# Patient Record
Sex: Female | Born: 1970 | Race: White | Hispanic: No | Marital: Single | Smoking: Current every day smoker
Health system: Southern US, Community
[De-identification: ages and names within clinical notes are randomized; demographics above are authoritative.]

## PROBLEM LIST (undated history)

## (undated) ENCOUNTER — Ambulatory Visit: Discharge status: Absent without leave | Payer: Self-pay

## (undated) DIAGNOSIS — K5790 Diverticulosis of intestine, part unspecified, without perforation or abscess without bleeding: Secondary | ICD-10-CM

## (undated) DIAGNOSIS — M545 Low back pain, unspecified: Secondary | ICD-10-CM

## (undated) DIAGNOSIS — K2970 Gastritis, unspecified, without bleeding: Secondary | ICD-10-CM

## (undated) DIAGNOSIS — R42 Dizziness and giddiness: Secondary | ICD-10-CM

## (undated) DIAGNOSIS — G894 Chronic pain syndrome: Secondary | ICD-10-CM

## (undated) DIAGNOSIS — IMO0002 Reserved for concepts with insufficient information to code with codable children: Secondary | ICD-10-CM

## (undated) DIAGNOSIS — N189 Chronic kidney disease, unspecified: Secondary | ICD-10-CM

## (undated) DIAGNOSIS — R202 Paresthesia of skin: Secondary | ICD-10-CM

## (undated) DIAGNOSIS — R200 Anesthesia of skin: Secondary | ICD-10-CM

## (undated) DIAGNOSIS — K449 Diaphragmatic hernia without obstruction or gangrene: Secondary | ICD-10-CM

## (undated) DIAGNOSIS — F419 Anxiety disorder, unspecified: Secondary | ICD-10-CM

## (undated) DIAGNOSIS — G43909 Migraine, unspecified, not intractable, without status migrainosus: Secondary | ICD-10-CM

## (undated) DIAGNOSIS — K219 Gastro-esophageal reflux disease without esophagitis: Secondary | ICD-10-CM

## (undated) HISTORY — DX: Dizziness and giddiness: R42

## (undated) HISTORY — DX: Gastro-esophageal reflux disease without esophagitis: K21.9

## (undated) HISTORY — DX: Reserved for concepts with insufficient information to code with codable children: IMO0002

## (undated) HISTORY — DX: Anesthesia of skin: R20.0

## (undated) HISTORY — DX: Low back pain, unspecified: M54.50

## (undated) HISTORY — DX: Low back pain: M54.5

## (undated) HISTORY — DX: Paresthesia of skin: R20.2

## (undated) HISTORY — DX: Anxiety disorder, unspecified: F41.9

## (undated) HISTORY — DX: Chronic kidney disease, unspecified: N18.9

## (undated) HISTORY — DX: Migraine, unspecified, not intractable, without status migrainosus: G43.909

## (undated) HISTORY — DX: Chronic pain syndrome: G89.4

---

## 1991-06-24 HISTORY — PX: TUBAL LIGATION: SHX77

## 2001-10-04 ENCOUNTER — Emergency Department (HOSPITAL_COMMUNITY): Admission: EM | Admit: 2001-10-04 | Discharge: 2001-10-04 | Payer: Self-pay | Admitting: Emergency Medicine

## 2001-10-04 ENCOUNTER — Encounter: Payer: Self-pay | Admitting: Emergency Medicine

## 2002-09-19 ENCOUNTER — Ambulatory Visit (HOSPITAL_COMMUNITY): Admission: RE | Admit: 2002-09-19 | Discharge: 2002-09-19 | Payer: Self-pay | Admitting: Family Medicine

## 2002-09-19 ENCOUNTER — Encounter: Payer: Self-pay | Admitting: Family Medicine

## 2003-03-11 ENCOUNTER — Encounter: Payer: Self-pay | Admitting: *Deleted

## 2003-03-11 ENCOUNTER — Emergency Department (HOSPITAL_COMMUNITY): Admission: EM | Admit: 2003-03-11 | Discharge: 2003-03-11 | Payer: Self-pay | Admitting: *Deleted

## 2003-08-17 ENCOUNTER — Ambulatory Visit (HOSPITAL_COMMUNITY): Admission: RE | Admit: 2003-08-17 | Discharge: 2003-08-17 | Payer: Self-pay | Admitting: Family Medicine

## 2003-12-10 ENCOUNTER — Emergency Department (HOSPITAL_COMMUNITY): Admission: EM | Admit: 2003-12-10 | Discharge: 2003-12-11 | Payer: Self-pay | Admitting: Emergency Medicine

## 2004-07-08 ENCOUNTER — Emergency Department (HOSPITAL_COMMUNITY): Admission: EM | Admit: 2004-07-08 | Discharge: 2004-07-08 | Payer: Self-pay | Admitting: *Deleted

## 2004-09-20 ENCOUNTER — Emergency Department (HOSPITAL_COMMUNITY): Admission: EM | Admit: 2004-09-20 | Discharge: 2004-09-20 | Payer: Self-pay | Admitting: *Deleted

## 2004-09-25 ENCOUNTER — Emergency Department (HOSPITAL_COMMUNITY): Admission: EM | Admit: 2004-09-25 | Discharge: 2004-09-25 | Payer: Self-pay | Admitting: Emergency Medicine

## 2005-01-04 ENCOUNTER — Emergency Department (HOSPITAL_COMMUNITY): Admission: EM | Admit: 2005-01-04 | Discharge: 2005-01-05 | Payer: Self-pay | Admitting: *Deleted

## 2005-01-05 ENCOUNTER — Emergency Department (HOSPITAL_COMMUNITY): Admission: EM | Admit: 2005-01-05 | Discharge: 2005-01-05 | Payer: Self-pay | Admitting: Emergency Medicine

## 2005-01-06 ENCOUNTER — Inpatient Hospital Stay (HOSPITAL_COMMUNITY): Admission: EM | Admit: 2005-01-06 | Discharge: 2005-01-07 | Payer: Self-pay | Admitting: *Deleted

## 2007-06-24 HISTORY — PX: CHOLECYSTECTOMY: SHX55

## 2007-10-05 ENCOUNTER — Emergency Department (HOSPITAL_COMMUNITY): Admission: EM | Admit: 2007-10-05 | Discharge: 2007-10-05 | Payer: Self-pay | Admitting: Emergency Medicine

## 2007-12-07 ENCOUNTER — Emergency Department (HOSPITAL_COMMUNITY): Admission: EM | Admit: 2007-12-07 | Discharge: 2007-12-07 | Payer: Self-pay | Admitting: Emergency Medicine

## 2008-02-06 ENCOUNTER — Emergency Department (HOSPITAL_COMMUNITY): Admission: EM | Admit: 2008-02-06 | Discharge: 2008-02-06 | Payer: Self-pay | Admitting: Emergency Medicine

## 2008-02-24 ENCOUNTER — Emergency Department (HOSPITAL_COMMUNITY): Admission: EM | Admit: 2008-02-24 | Discharge: 2008-02-24 | Payer: Self-pay | Admitting: Emergency Medicine

## 2008-05-04 ENCOUNTER — Emergency Department (HOSPITAL_COMMUNITY): Admission: EM | Admit: 2008-05-04 | Discharge: 2008-05-04 | Payer: Self-pay | Admitting: Emergency Medicine

## 2008-05-16 ENCOUNTER — Observation Stay (HOSPITAL_COMMUNITY): Admission: EM | Admit: 2008-05-16 | Discharge: 2008-05-17 | Payer: Self-pay | Admitting: Emergency Medicine

## 2008-05-17 ENCOUNTER — Encounter (INDEPENDENT_AMBULATORY_CARE_PROVIDER_SITE_OTHER): Payer: Self-pay | Admitting: General Surgery

## 2009-05-12 ENCOUNTER — Emergency Department (HOSPITAL_COMMUNITY): Admission: EM | Admit: 2009-05-12 | Discharge: 2009-05-12 | Payer: Self-pay | Admitting: Emergency Medicine

## 2009-10-09 ENCOUNTER — Emergency Department (HOSPITAL_COMMUNITY): Admission: EM | Admit: 2009-10-09 | Discharge: 2009-10-09 | Payer: Self-pay | Admitting: Emergency Medicine

## 2009-10-16 ENCOUNTER — Emergency Department (HOSPITAL_COMMUNITY): Admission: EM | Admit: 2009-10-16 | Discharge: 2009-10-16 | Payer: Self-pay | Admitting: Emergency Medicine

## 2009-10-29 ENCOUNTER — Encounter (INDEPENDENT_AMBULATORY_CARE_PROVIDER_SITE_OTHER): Payer: Self-pay | Admitting: *Deleted

## 2010-04-02 ENCOUNTER — Emergency Department (HOSPITAL_COMMUNITY): Admission: EM | Admit: 2010-04-02 | Discharge: 2010-04-02 | Payer: Self-pay | Admitting: Emergency Medicine

## 2010-07-23 NOTE — Letter (Signed)
Summary: *Orthopedic No Show Letter  [facility]  [address]  [place], [place] [address]   Phone: [phone]  Fax: [phone]     10/29/2009   Vallie Fayette [address] [place], [place]  [address]     Dear Ms. Sedlack,   Our records indicate that you missed your scheduled appointment with Dr. Beaulah Corin on 10/22/2009.   Please contact this office to reschedule your appointment as soon as possible.  It is important that you keep your scheduled appointments with your physician, so we can provide you the best care possible.        Sincerely,    Dr. Terrance Mass, MD [facility] Phone [phone]

## 2010-09-09 ENCOUNTER — Other Ambulatory Visit (HOSPITAL_COMMUNITY): Payer: Self-pay | Admitting: Family Medicine

## 2010-09-09 ENCOUNTER — Encounter (HOSPITAL_COMMUNITY): Payer: Self-pay

## 2010-09-09 ENCOUNTER — Ambulatory Visit (HOSPITAL_COMMUNITY)
Admission: RE | Admit: 2010-09-09 | Discharge: 2010-09-09 | Disposition: A | Discharge status: Home / Self Care | Payer: BC Managed Care – PPO | Source: Ambulatory Visit | Attending: Family Medicine | Admitting: Family Medicine

## 2010-09-09 DIAGNOSIS — M545 Low back pain, unspecified: Secondary | ICD-10-CM | POA: Insufficient documentation

## 2010-09-09 DIAGNOSIS — M79609 Pain in unspecified limb: Secondary | ICD-10-CM | POA: Insufficient documentation

## 2010-09-09 DIAGNOSIS — M549 Dorsalgia, unspecified: Secondary | ICD-10-CM

## 2010-09-09 DIAGNOSIS — M47817 Spondylosis without myelopathy or radiculopathy, lumbosacral region: Secondary | ICD-10-CM | POA: Insufficient documentation

## 2010-09-27 ENCOUNTER — Emergency Department (HOSPITAL_COMMUNITY)
Admission: EM | Admit: 2010-09-27 | Discharge: 2010-09-27 | Disposition: A | Discharge status: Home / Self Care | Payer: BC Managed Care – PPO | Attending: Emergency Medicine | Admitting: Emergency Medicine

## 2010-09-27 ENCOUNTER — Emergency Department (HOSPITAL_COMMUNITY): Payer: BC Managed Care – PPO

## 2010-09-27 DIAGNOSIS — T40605A Adverse effect of unspecified narcotics, initial encounter: Secondary | ICD-10-CM | POA: Insufficient documentation

## 2010-09-27 DIAGNOSIS — W19XXXA Unspecified fall, initial encounter: Secondary | ICD-10-CM | POA: Insufficient documentation

## 2010-09-27 DIAGNOSIS — Y929 Unspecified place or not applicable: Secondary | ICD-10-CM | POA: Insufficient documentation

## 2010-09-27 DIAGNOSIS — M25569 Pain in unspecified knee: Secondary | ICD-10-CM | POA: Insufficient documentation

## 2010-09-27 DIAGNOSIS — L270 Generalized skin eruption due to drugs and medicaments taken internally: Secondary | ICD-10-CM | POA: Insufficient documentation

## 2010-09-27 DIAGNOSIS — S8000XA Contusion of unspecified knee, initial encounter: Secondary | ICD-10-CM | POA: Insufficient documentation

## 2010-10-27 ENCOUNTER — Emergency Department (HOSPITAL_COMMUNITY)
Admission: EM | Admit: 2010-10-27 | Discharge: 2010-10-27 | Disposition: A | Discharge status: Home / Self Care | Payer: BC Managed Care – PPO | Attending: Emergency Medicine | Admitting: Emergency Medicine

## 2010-10-27 DIAGNOSIS — T622X1A Toxic effect of other ingested (parts of) plant(s), accidental (unintentional), initial encounter: Secondary | ICD-10-CM | POA: Insufficient documentation

## 2010-10-27 DIAGNOSIS — L255 Unspecified contact dermatitis due to plants, except food: Secondary | ICD-10-CM | POA: Insufficient documentation

## 2010-11-05 NOTE — H&P (Signed)
NAME:  Danielle Vazquez, Danielle Vazquez                ACCOUNT NO.:  [id number]   MEDICAL RECORD NO.:  [id number]          PATIENT TYPE:  OBV   LOCATION:  A330                          FACILITY:  [facility]   PHYSICIAN:  Dalia Heading, M.D.  DATE OF BIRTH:  [birth year]   DATE OF ADMISSION:  05/16/2008  DATE OF DISCHARGE:  LH                              HISTORY & PHYSICAL   AGE:  40 years old.   CHIEF COMPLAINT:  Right upper quadrant abdominal pain and nausea, known  history of cholelithiasis.   HISTORY OF PRESENT ILLNESS:  The patient is a 40 year old white female  who presented to the emergency room from another surgeon's office with  worsening right upper quadrant abdominal pain and nausea.  She has a  known history of cholelithiasis with an ultrasound done in June of this  year which showed cholelithiasis with a normal common bile duct.  She  states her symptoms started this morning.  She was in the emergency room  last week with similar symptoms.   PAST MEDICAL HISTORY:  Unremarkable.   PAST SURGICAL HISTORY:  Tubal ligation.   CURRENT MEDICATIONS:  Phenergan p.r.n.   ALLERGIES:  No known drug allergies.   SOCIAL HISTORY:  The patient does smoke.  She denies drug abuse or  alcohol use.   PHYSICAL EXAMINATION:  CONSTITUTIONAL:  The patient is a well-developed,  well-nourished white female in no acute distress.  VITAL SIGNS:  She is afebrile.  Vital signs stable.  HEENT:  Examination reveals no scleral icterus.  LUNGS:  Clear to auscultation with equal breath sounds bilaterally.  HEART:  Examination reveals regular rate and rhythm without S3, S4,  murmurs.  ABDOMEN:  Soft with tenderness noted in the right upper quadrant to  palpation.  No hepatosplenomegaly, masses, hernias are identified.   White blood cell count 7.9, hemoglobin 14.1, hematocrit 41.7, platelet  count 288,000.  MET-7 is within normal limits.  Liver enzyme tests are  within normal limits.   IMPRESSION:  Biliary colic,  cholelithiasis.   PLAN:  The patient to be admitted to hospital for control of her pain  and nausea.  She subsequently will undergo laparoscopic cholecystectomy.  Risks and benefits of the procedure including bleeding, infection,  hepatobiliary injury, and the possibly of an open procedure were fully  explained to the patient, gave informed consent.      Dalia Heading, M.D.  Electronically Signed     MAJ/MEDQ  D:  05/16/2008  T:  05/16/2008  Job:  [id number]

## 2010-11-05 NOTE — Op Note (Signed)
NAME:  Danielle Vazquez, Danielle Vazquez                ACCOUNT NO.:  [id number]   MEDICAL RECORD NO.:  [id number]          PATIENT TYPE:  OBV   LOCATION:  A330                          FACILITY:  [facility]   PHYSICIAN:  Dalia Heading, M.D.  DATE OF BIRTH:  [birth year]   DATE OF PROCEDURE:  05/17/2008  DATE OF DISCHARGE:  05/17/2008                               OPERATIVE REPORT   PREOPERATIVE DIAGNOSES:  Acute cholecystitis, cholelithiasis.   POSTOPERATIVE DIAGNOSES:  Acute cholecystitis, cholelithiasis.   PROCEDURE:  Laparoscopic cholecystectomy.   SURGEON:  Dalia Heading, MD   ANESTHESIA:  General endotracheal.   INDICATIONS:  The patient is a 40 year old white female presents with  acute cholecystitis secondary to cholelithiasis.  The risks and benefits  of the procedure including bleeding, infection, hepatobiliary injury,  the possibly of an open procedure were fully explained to the patient,  gave informed consent.   PROCEDURE NOTE:  The patient was placed in the supine position.  After  induction of general endotracheal anesthesia, the abdomen was prepped  and draped using the usual sterile technique with Betadine.  Surgical  site confirmation was performed.   A supraumbilical incision was made down to the fascia.  A Veress needle  was introduced into the abdominal cavity and confirmation of placement  was done using the saline drop test.  The abdomen was then insufflated  to 16 mmHg pressure.  An 11-mm trocar was introduced into the abdominal  cavity under direct visualization without difficulty.  The patient was  placed in reversed Trendelenburg position.  An additional 11-mm trocar  was placed in the epigastric region and 5-mm trocars were placed in the  right upper quadrant and right flank regions.  The liver was inspected  and noted to be within normal limits.  The gallbladder was retracted  superiorly and laterally.  The dissection was begun around the  infundibulum of the gallbladder.   The cystic duct was first identified.  The juncture to the infundibulum fully identified.  Endoclips were  placed proximally and distally on the cystic duct and cystic duct was  divided.  This was likewise done as cystic artery.  The gallbladder was  then freed away from the gallbladder fossa using Bovie electrocautery.  The gallbladder was delivered through the epigastric trocar site using  Endocatch bag.  The gallbladder fossa was inspected and no abnormal  bleeding or bile leakage was noted.  Surgicel was placed in the  gallbladder fossa.  All fluid and air were then evacuated from the  abdominal cavity prior to removal of the trocars.   All wounds were irrigated with normal saline.  All wounds were injected  with 0.5% Sensorcaine.  The supraumbilical fascia was reapproximated  using an 0 Vicryl interrupted suture.  All skin incisions were closed  using staples.  Betadine ointment and dry sterile dressings were  applied.   All tape and needle counts were correct at the end of the procedure.  The patient was extubated in the operating room, went back to recovery  room awake and in stable condition.   COMPLICATIONS:  None.  SPECIMEN:  Gallbladder.   BLOOD LOSS:  Minimal.      Dalia Heading, M.D.  Electronically Signed     MAJ/MEDQ  D:  05/17/2008  T:  05/17/2008  Job:  [id number]

## 2010-11-08 ENCOUNTER — Emergency Department (HOSPITAL_COMMUNITY)
Admission: EM | Admit: 2010-11-08 | Discharge: 2010-11-08 | Disposition: A | Discharge status: Home / Self Care | Payer: BC Managed Care – PPO | Attending: Emergency Medicine | Admitting: Emergency Medicine

## 2010-11-08 DIAGNOSIS — T148 Other injury of unspecified body region: Secondary | ICD-10-CM | POA: Insufficient documentation

## 2010-11-08 DIAGNOSIS — W57XXXA Bitten or stung by nonvenomous insect and other nonvenomous arthropods, initial encounter: Secondary | ICD-10-CM | POA: Insufficient documentation

## 2010-11-08 NOTE — H&P (Signed)
NAME:  Danielle Vazquez, BROCKER                ACCOUNT NO.:  [id number]   MEDICAL RECORD NO.:  [id number]          PATIENT TYPE:  INP   LOCATION:  A311                          FACILITY:  [facility]   PHYSICIAN:  Jackie Plum, M.D.DATE OF BIRTH:  January 14, 1971   DATE OF ADMISSION:  01/05/2005  DATE OF DISCHARGE:  LH                                HISTORY & PHYSICAL   CHIEF COMPLAINT:  Right side and back pain.   HISTORY OF PRESENT ILLNESS:  The patient presents with a four day history of  right side and back pain.  She had been seen in ED on July 15, and left AMA.  She returns to the ED because of continued pain.  I consulted the patient.  Apparently, she came to the emergency room on account of the same,  whereupon, she stated that she had some blood in her urine and left AMA  after some relief of her pain with medication.  She comes back with  continued pain, low-grade fever, nausea and some vomiting.  Pain is  described as moderate in intensity without any known gravitating or  elevating factor.  She has no history of coughing, sputum production, chest  pain, shortness of breath, dizziness, abdominal pain, diarrhea, dysuria or  frequency of micturition.  Upon admission to the ED, the patient was given  Dilaudid IV antibiotics and lab work was obtained.  Also, her CT scan  obtained on July 15 was also reviewed which indicated possible right renal  inflammation.  She was therefore admitted for PACU for right pyelonephritis.   PAST MEDICAL HISTORY:  The patient denies any history of hypertension,  diabetes mellitus or previous urinary infection.   MEDICATIONS:  She indicates that she is not on any other known medications  apart from Levaquin that she was sent home with.   ALLERGIES:  No known drug allergies.   FAMILY HISTORY:  Positive for diabetes mellitus.   SOCIAL HISTORY:  The patient smokes one pack of cigarettes daily.  Drinks  alcohol on a social basis.  No illicit drug use.   REVIEW  OF SYSTEMS:  Significant for positives and negatives as listed in the  HPI; otherwise unremarkable.  She had some headaches, which had resolved  however.   PHYSICAL EXAMINATION:  VITAL SIGNS:  Blood pressure 92/61, pulse 103 (pulse  was initially 137), temperature 100.3 degrees Fahrenheit (initially 98.5  degrees Fahrenheit).  O2 sat 99% on room air.  GENERAL APPEARANCE:  The patient was in moderate pain for distress, not in  any acute cardiopulmonary distress.  HEENT:  Normocephalic, atraumatic.  Pupils equal, round, reactive to light.  Extraocular movements intact.  Oropharynx was moist, no exudate or erythema.  NECK:  Supple.  NO JVD.  LUNGS:  Clear to auscultation.  CARDIAC:  Mildly tachycardic without any gallops or murmur.  ABDOMEN:  Soft, nontender.  She had exquisite right costovertebral angle  tenderness.  EXTREMITIES:  No cyanosis or edema.  CNS:  Nonfocal.   LABORATORY DATA:  Blood and urine culture were collected at the ED and is to  be followed up.  WBC count 7, hemoglobin 11.8, hematocrit 33.4, bilirubin  2.8.  Sodium 129, potassium 3.2, chloride 101, CO2 19, glucose 154, BUN 5,  creatinine 0.9, calcium 7.7.  UA from July 16 indicated a cloudy appearance,  specific gravity of 1.025, pH of 5.0, negative glucose, ketones and has  small bilirubin, moderate blood, 100 mg/dL of protein, urobilinogen of 1,  nitrite positive, moderate leukocytes and WBC too numerous to count, 11-20  per high-powered field of WBC and many bacteria.   IMPRESSION:  1.  Acute right pyelonephritis.  2.  Electrolyte imbalance of hyponatremia and hypokalemia, likely secondary      to gastrointestinal loss.   PLAN:  The patient will be admitted to the hospital.  She will be given  antibiotics and analgesics, and we will replete her potassium and get some  saline and follow up clinically.  Blood culture and urine culture will need  to be followed up and adjustments made in the antibiotic regimen as  needed.       GO/MEDQ  D:  01/06/2005  T:  01/06/2005  Job:  [id number]

## 2010-12-27 ENCOUNTER — Other Ambulatory Visit (HOSPITAL_COMMUNITY): Payer: Self-pay | Admitting: Internal Medicine

## 2010-12-27 DIAGNOSIS — Z139 Encounter for screening, unspecified: Secondary | ICD-10-CM

## 2011-03-20 LAB — DIFFERENTIAL
Basophils Absolute: 0.1
Lymphocytes Relative: 26
Monocytes Relative: 7
Neutro Abs: 6.1
Neutrophils Relative %: 62

## 2011-03-20 LAB — COMPREHENSIVE METABOLIC PANEL
Albumin: 3 — ABNORMAL LOW
Chloride: 112
GFR calc Af Amer: >60
GFR calc non Af Amer: >60
Sodium: 136
Total Bilirubin: 0.5
Total Protein: 5.4 — ABNORMAL LOW

## 2011-03-20 LAB — LIPASE, BLOOD: Lipase: 30

## 2011-03-20 LAB — CBC
HCT: 39.5
Hemoglobin: 13.8
MCV: 89.3
Platelets: 337
RBC: 4.43

## 2011-03-25 LAB — DIFFERENTIAL
Basophils Absolute: 0
Eosinophils Absolute: 0.3
Eosinophils Absolute: 0.4
Eosinophils Relative: 4
Lymphocytes Relative: 22
Lymphs Abs: 1.5
Lymphs Abs: 2
Monocytes Absolute: 0.5
Monocytes Relative: 6
Neutro Abs: 5.6
Neutrophils Relative %: 66
Neutrophils Relative %: 71

## 2011-03-25 LAB — COMPREHENSIVE METABOLIC PANEL
ALT: 11
BUN: 10
CO2: 25
Chloride: 103
Chloride: 106
GFR calc Af Amer: >60
GFR calc non Af Amer: >60
Glucose, Bld: 102 — ABNORMAL HIGH
Glucose, Bld: 120 — ABNORMAL HIGH
Potassium: 3.8
Sodium: 135
Sodium: 136
Total Protein: 6.4
Total Protein: 6.6

## 2011-03-25 LAB — URINALYSIS, ROUTINE W REFLEX MICROSCOPIC
Glucose, UA: NEGATIVE
Ketones, ur: NEGATIVE
Leukocytes, UA: NEGATIVE
Nitrite: NEGATIVE
Protein, ur: NEGATIVE
Specific Gravity, Urine: 1.025
Urobilinogen, UA: 0.2
pH: 6

## 2011-03-25 LAB — CBC
HCT: 43.5
MCHC: 33
Platelets: 284
RBC: 4.6
RBC: 4.78
WBC: 7.9

## 2011-03-26 LAB — URINALYSIS, ROUTINE W REFLEX MICROSCOPIC
Hgb urine dipstick: NEGATIVE
Nitrite: NEGATIVE
Specific Gravity, Urine: 1.02
Urobilinogen, UA: 0.2

## 2011-03-26 LAB — COMPREHENSIVE METABOLIC PANEL
ALT: 11
AST: 14
CO2: 23
Chloride: 110
GFR calc Af Amer: >60
GFR calc non Af Amer: >60
Potassium: 4
Sodium: 136
Total Bilirubin: 0.5

## 2011-03-26 LAB — CBC
RBC: 4.54
WBC: 7.4

## 2011-03-26 LAB — DIFFERENTIAL
Basophils Absolute: 0.1
Eosinophils Absolute: 0.6
Eosinophils Relative: 8 — ABNORMAL HIGH
Monocytes Absolute: 0.5

## 2011-03-26 LAB — LIPASE, BLOOD: Lipase: 29

## 2011-05-01 ENCOUNTER — Ambulatory Visit (HOSPITAL_COMMUNITY)
Admission: RE | Admit: 2011-05-01 | Discharge: 2011-05-01 | Disposition: A | Discharge status: Home / Self Care | Payer: Self-pay | Source: Ambulatory Visit | Attending: Internal Medicine | Admitting: Internal Medicine

## 2011-05-01 DIAGNOSIS — Z1231 Encounter for screening mammogram for malignant neoplasm of breast: Secondary | ICD-10-CM | POA: Insufficient documentation

## 2011-05-01 DIAGNOSIS — Z139 Encounter for screening, unspecified: Secondary | ICD-10-CM

## 2011-05-30 ENCOUNTER — Other Ambulatory Visit (HOSPITAL_COMMUNITY): Payer: Self-pay | Admitting: Internal Medicine

## 2011-05-30 DIAGNOSIS — K219 Gastro-esophageal reflux disease without esophagitis: Secondary | ICD-10-CM

## 2011-05-30 DIAGNOSIS — R109 Unspecified abdominal pain: Secondary | ICD-10-CM

## 2011-05-30 LAB — COMPREHENSIVE METABOLIC PANEL
ALT: 25 U/L (ref 7–35)
BUN: 9 mg/dL (ref 4–21)
CO2: 22 mmol/L
Creat: 0.85
Total Bilirubin: 0.5 mg/dL

## 2011-05-30 LAB — AMYLASE
Helicobacter Pylori Antibody-IgG: 3.5
Lipase: 12 units/L (ref 0–53)

## 2011-05-30 LAB — CBC WITH DIFFERENTIAL/PLATELET
HCT: 46 %
Hemoglobin: 15.5 g/dL (ref 12.0–16.0)
MCV: 92.9 fL
RDW: 14.2
WBC: 9

## 2011-06-04 ENCOUNTER — Ambulatory Visit (HOSPITAL_COMMUNITY)
Admission: RE | Admit: 2011-06-04 | Discharge: 2011-06-04 | Disposition: A | Discharge status: Home / Self Care | Payer: Self-pay | Source: Ambulatory Visit | Attending: Internal Medicine | Admitting: Internal Medicine

## 2011-06-04 DIAGNOSIS — R109 Unspecified abdominal pain: Secondary | ICD-10-CM | POA: Insufficient documentation

## 2011-06-04 DIAGNOSIS — K219 Gastro-esophageal reflux disease without esophagitis: Secondary | ICD-10-CM

## 2011-06-11 ENCOUNTER — Emergency Department (HOSPITAL_COMMUNITY)
Admission: EM | Admit: 2011-06-11 | Discharge: 2011-06-11 | Disposition: A | Discharge status: Home / Self Care | Payer: Self-pay | Attending: Emergency Medicine | Admitting: Emergency Medicine

## 2011-06-11 ENCOUNTER — Encounter (HOSPITAL_COMMUNITY): Payer: Self-pay | Admitting: Emergency Medicine

## 2011-06-11 DIAGNOSIS — Z79899 Other long term (current) drug therapy: Secondary | ICD-10-CM | POA: Insufficient documentation

## 2011-06-11 DIAGNOSIS — I7300 Raynaud's syndrome without gangrene: Secondary | ICD-10-CM | POA: Insufficient documentation

## 2011-06-11 DIAGNOSIS — F17200 Nicotine dependence, unspecified, uncomplicated: Secondary | ICD-10-CM | POA: Insufficient documentation

## 2011-06-11 DIAGNOSIS — R209 Unspecified disturbances of skin sensation: Secondary | ICD-10-CM | POA: Insufficient documentation

## 2011-06-11 NOTE — ED Notes (Signed)
Rt thumb, cool to touch with sl mottling. Denies injury.  Good radial pulse and rest of hand is warm.Onset 1 week ago.

## 2011-06-11 NOTE — ED Notes (Signed)
No known injury, no working with hands/ machinery, no know bites to area etc.   Came here last week but waiting room very crowed busy - left.  Could not get in to see her MD.

## 2011-06-11 NOTE — ED Provider Notes (Signed)
History     CSN: [id number] Arrival date & time: 06/11/2011  2:12 PM   First MD Initiated Contact with Patient 06/11/11 1429      Chief Complaint  Patient presents with  . Cold Extremity    right thumb cold and discolored    (Consider location/radiation/quality/duration/timing/severity/associated sxs/prior treatment) HPI Comments: Patient complains of cool tingling right thumb and second finger. Patient states she has had some minor changes in the sensation and color of her fingers in the past. But they would usually pass away without any problem. Within the last week the patient has noted increased frequency of coldness of her hands. The hands seem to be slow to change color or sensation even when she places them under covers. The patient denies recent injury to the hand (right). She denies excessive repetitive movements involving the hands. She has not been evaluated for this problem by her primary physician or primary specialist. She denies previous injury or procedur to the right hand. The patient presents for evaluation of this particular problem.e  The history is provided by the patient.    History reviewed. No pertinent past medical history.  Past Surgical History  Procedure Date  . Cholecystectomy 6-7 years    Family History  Problem Relation Age of Onset  . Heart attack Mother   . Heart attack Father     History  Substance Use Topics  . Smoking status: Current Everyday Smoker -- 1.0 packs/day for 25 years    Types: Cigarettes  . Smokeless tobacco: Not on file  . Alcohol Use: No    OB History    Grav Para Term Preterm Abortions TAB SAB Ect Mult Living                  Review of Systems  Constitutional: Negative for activity change.       All ROS Neg except as noted in HPI  HENT: Negative for nosebleeds and neck pain.   Eyes: Negative for photophobia and discharge.  Respiratory: Negative for cough, shortness of breath and wheezing.   Cardiovascular:  Negative for chest pain and palpitations.  Gastrointestinal: Negative for abdominal pain and blood in stool.  Genitourinary: Negative for dysuria, frequency and hematuria.  Musculoskeletal: Negative for back pain and arthralgias.  Skin: Negative.   Neurological: Negative for dizziness, seizures and speech difficulty.  Psychiatric/Behavioral: Negative for hallucinations and confusion.    Allergies  Codeine  Home Medications   Current Outpatient Rx  Name Route Sig Dispense Refill  . ALPRAZOLAM 0.25 MG PO TABS Oral Take 0.25 mg by mouth 4 (four) times daily.      Marland Kitchen CARBAMAZEPINE 100 MG PO CHEW Oral Chew by mouth 2 (two) times daily.      . CYCLOBENZAPRINE HCL 10 MG PO TABS Oral Take 10 mg by mouth 3 (three) times daily as needed. For muscle spasms    . OXYCODONE HCL 15 MG PO TABS Oral Take 15 mg by mouth every 4 (four) hours as needed. Five to six per day     . PANTOPRAZOLE SODIUM 40 MG PO TBEC Oral Take 40 mg by mouth daily.        BP 112/85  Pulse 98  Temp(Src) 97.7 F (36.5 C) (Oral)  Resp 16  Ht 5\' 5"  (1.651 m)  Wt 145 lb (65.772 kg)  BMI 24.13 kg/m2  SpO2 99%  LMP 06/09/2011  Physical Exam  Nursing note and vitals reviewed. Constitutional: She is oriented to person, place, and time.  She appears well-developed and well-nourished.  Non-toxic appearance.  HENT:  Head: Normocephalic.  Right Ear: Tympanic membrane and external ear normal.  Left Ear: Tympanic membrane and external ear normal.  Eyes: EOM and lids are normal. Pupils are equal, round, and reactive to light.  Neck: Normal range of motion. Neck supple. Carotid bruit is not present.  Cardiovascular: Normal rate, regular rhythm, normal heart sounds, intact distal pulses and normal pulses.   Pulmonary/Chest: Breath sounds normal. No respiratory distress.  Abdominal: Soft. Bowel sounds are normal. There is no tenderness. There is no guarding.  Musculoskeletal: Normal range of motion.       The fingernails of the  right and left hand have been chewed significantly. There is mottling and coolness of the tip of the right thumb and right second finger. The greater and lesser thenar are pink. The radial pulses are symmetrical. There is rapid filling of the pulmonary arch. The brachial pulse is symmetrical.  Lymphadenopathy:       Head (right side): No submandibular adenopathy present.       Head (left side): No submandibular adenopathy present.    She has no cervical adenopathy.  Neurological: She is alert and oriented to person, place, and time. She has normal strength. No cranial nerve deficit or sensory deficit.  Skin: Skin is warm and dry.  Psychiatric: She has a normal mood and affect. Her speech is normal.    ED Course  Procedures (including critical care time) After placing the hand under a warm blanket, the majority of the discoloration went away. The hand became warmer. The tingling in the tips improved, but did not completely resolve. Labs Reviewed - No data to display No results found.   1. Raynaud phenomenon       MDM  I have reviewed nursing notes, vital signs, and all appropriate lab and imaging results for this patient.  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 CCB not started due to b/p of 112/85. Pt strongly advised to use warm gloves and blankets. Rheumatology consult suggested.     Kathie Dike, [place] 06/11/11 1714

## 2011-06-11 NOTE — ED Notes (Signed)
A week ago right index finger was cold and discolored and somewhat painful   Two days later the right thumb started doing the same - cold, mottled and feels like it is asleep.  Tingles and burns

## 2011-06-12 NOTE — ED Provider Notes (Signed)
Medical screening examination/treatment/procedure(s) were conducted as a shared visit with non-physician practitioner(s) and myself.  I personally evaluated the patient during the encounter.  History and physical consistent with Raynaud's phenomena.    Donnetta Hutching, MD 06/12/11 [phone]

## 2011-06-30 ENCOUNTER — Other Ambulatory Visit (HOSPITAL_COMMUNITY): Payer: Self-pay | Admitting: Internal Medicine

## 2011-06-30 DIAGNOSIS — R109 Unspecified abdominal pain: Secondary | ICD-10-CM

## 2011-06-30 DIAGNOSIS — G8929 Other chronic pain: Secondary | ICD-10-CM

## 2011-07-02 ENCOUNTER — Ambulatory Visit (HOSPITAL_COMMUNITY)
Admission: RE | Admit: 2011-07-02 | Discharge: 2011-07-02 | Disposition: A | Discharge status: Home / Self Care | Payer: Self-pay | Source: Ambulatory Visit | Attending: Internal Medicine | Admitting: Internal Medicine

## 2011-07-02 DIAGNOSIS — R933 Abnormal findings on diagnostic imaging of other parts of digestive tract: Secondary | ICD-10-CM | POA: Insufficient documentation

## 2011-07-02 DIAGNOSIS — G8929 Other chronic pain: Secondary | ICD-10-CM

## 2011-07-02 DIAGNOSIS — R109 Unspecified abdominal pain: Secondary | ICD-10-CM | POA: Insufficient documentation

## 2011-07-02 MED ORDER — IOHEXOL 300 MG/ML  SOLN
100.0000 mL | Freq: Once | INTRAMUSCULAR | Status: AC | PRN
  Administered 2011-07-02: 100 mL via INTRAVENOUS

## 2011-07-22 ENCOUNTER — Encounter: Payer: Self-pay | Admitting: Gastroenterology

## 2011-07-22 ENCOUNTER — Ambulatory Visit (INDEPENDENT_AMBULATORY_CARE_PROVIDER_SITE_OTHER): Payer: Self-pay | Admitting: Gastroenterology

## 2011-07-22 VITALS — BP 105/71 | HR 87 | Temp 98.1°F | Ht 66.0 in | Wt 163.2 lb

## 2011-07-22 DIAGNOSIS — R894 Abnormal immunological findings in specimens from other organs, systems and tissues: Secondary | ICD-10-CM

## 2011-07-22 DIAGNOSIS — R768 Other specified abnormal immunological findings in serum: Secondary | ICD-10-CM

## 2011-07-22 DIAGNOSIS — R7689 Other specified abnormal immunological findings in serum: Secondary | ICD-10-CM | POA: Insufficient documentation

## 2011-07-22 DIAGNOSIS — K921 Melena: Secondary | ICD-10-CM | POA: Insufficient documentation

## 2011-07-22 DIAGNOSIS — R109 Unspecified abdominal pain: Secondary | ICD-10-CM

## 2011-07-22 MED ORDER — POLYETHYLENE GLYCOL 3350 17 GM/SCOOP PO POWD: 17.0000 g | Freq: Every day | ORAL | Status: AC

## 2011-07-22 NOTE — Assessment & Plan Note (Signed)
See notes under abdominal pain.  

## 2011-07-22 NOTE — Assessment & Plan Note (Addendum)
41 year old female with 2.5 month history of epigastric and RUQ pain, associated with early satiety, constant nausea, intermittent vomiting. Nocturnal reflux noted. No NSAIDs or aspirin powders, no melena noted. No anemia on CBC from PCP, amylase and lipase normal. LFTs normal. [place] normal, CT as outlined above. H.pylori serologies positive. Due to finances was treated by splitting up medications (Prevpac) to generic forms. Unfortunately, pt did not take all medications concurrently. Still completing Amoxicillin. Likely if she did have active infection, eradication compromised due to inability to take appropriate dosing as prescribed.   Also notes lower abdominal cramping and diffuse abdominal pain at times. No diarrhea but does report constipation. CT as outlined above, needs TCS to evaluate for any underlying etiology.  Needs EGD due to new-onset dyspepsia, continued reflux despite PPI. Likely culprit H.pylori gastritis, possible ulcer component.  Proceed with upper endoscopy and TCS IN THE OR secondary to polypharmacy  with Dr. Darrick Penna. The risks, benefits, and alternatives have been discussed in detail with patient. They have stated understanding and desire to proceed.  Continue Protonix Stool studies if diarrhea Miralax for constipation High fiber diet

## 2011-07-22 NOTE — Progress Notes (Signed)
Faxed to PCP

## 2011-07-22 NOTE — Assessment & Plan Note (Signed)
Intermittent, low-volume. Likely benign anorectal source. TCS in future.

## 2011-07-22 NOTE — Progress Notes (Signed)
Referring Provider: Cassell Smiles., MD Primary Care Physician:  Cassell Smiles., MD, MD Primary Gastroenterologist:  Dr. Darrick Penna  Chief Complaint  Patient presents with  . Abdominal Pain  . Bloated    HPI:    [name] presents today at the request of Dr. Sherwood Gambler secondary to abdominal pain. LFTs normal in Dec 2012 at office. Hgb 15.5. No leukocytosis. Amylase and Lipase normal. H.pylori serology positive at 3.50. Was going to be treated with a Prevpac, too expensive. Split up medications. Due to finances, did not take all concurrently. Still finishing up Amoxicillin.   Reports onset of epigastric pain, extending to RUQ approximately 2.5 months ago. Associated nausea and vomiting. Feels nauseated all the time. Pain is worse with eating. Intermittent reflux. Nocturnal reflux waking her up. No dysphagia/odynophagia. States she is starving but has early satiety. One episode of hematochezia. No melena.  Reports mainly constipation. BM once every 2-3 days. Reports diffuse abdominal crampy pain, lower abdominal pain intermittently. Sometimes improved after bowel movement, sometimes not. No diarrhea. No NSAIDs or aspirin powders.     [place] abd Dec 2012: Normal  CT IMPRESSION Jan 2013 1. Mild circumferential wall thickening of a segment of the  proximal to mid sigmoid colon with some prominence/injection of the  surrounding sigmoid mesenteric vasculature. Findings could reflect  infectious colitis or inflammatory bowel disease.  2. Small focal area of renal cortical scarring posterior left mid  kidney. This suggests the possibility of prior urinary tract  infection.  3. Cholecystectomy.   Past Medical History  Diagnosis Date  . Anxiety   . GERD (gastroesophageal reflux disease)   . Chronic pain syndrome   . DDD (degenerative disc disease)     has had injections in past    Past Surgical History  Procedure Date  . Cholecystectomy 6-7 years  . Tubal ligation     Current  Outpatient Prescriptions  Medication Sig Dispense Refill  . ALPRAZolam (XANAX) 0.25 MG tablet Take 0.25 mg by mouth 4 (four) times daily.        Marland Kitchen amoxicillin (AMOXIL) 500 MG capsule Take 500 mg by mouth 2 (two) times daily.      . carbamazepine (TEGRETOL) 100 MG chewable tablet Chew by mouth 2 (two) times daily.        . cyclobenzaprine (FLEXERIL) 10 MG tablet Take 10 mg by mouth 3 (three) times daily as needed. For muscle spasms      . oxyCODONE (ROXICODONE) 15 MG immediate release tablet Take 15 mg by mouth every 4 (four) hours as needed. Five to six per day       . pantoprazole (PROTONIX) 40 MG tablet Take 40 mg by mouth daily.        . polyethylene glycol powder (MIRALAX) powder Take 17 g by mouth daily. Daily as needed for BM  255 g  3    Allergies as of 07/22/2011 - Review Complete 07/22/2011  Allergen Reaction Noted  . Codeine Nausea Only and Rash 09/09/2010    Family History  Problem Relation Age of Onset  . Heart attack Mother   . Heart attack Father   . Colon cancer Neg Hx     History   Social History  . Marital Status: Single    Spouse Name: N/A    Number of Children: N/A  . Years of Education: N/A   Occupational History  . unemployed    Social History Main Topics  . Smoking status: Current Everyday Smoker -- 1.0 packs/day for  25 years    Types: Cigarettes  . Smokeless tobacco: Not on file  . Alcohol Use: No  . Drug Use: No  . Sexually Active: Yes    Birth Control/ Protection: Surgical, Other-see comments   Other Topics Concern  . Not on file   Social History Narrative  . No narrative on file    Review of Systems: Gen: SEE HPI CV: Denies chest pain, heart palpitations, syncope, peripheral edema. Resp: Denies shortness of breath with rest, cough, wheezing GI: SEE HP I GU : Denies urinary burning, urinary frequency, urinary incontinence.  MS: Denies joint pain, muscle weakness, cramps, limited movement Derm: Denies rash, itching, dry skin Psych:  Denies depression, anxiety, confusion or memory loss  Heme: Denies bruising, bleeding, and enlarged lymph nodes.  Physical Exam: BP 105/71  Pulse 87  Temp(Src) 98.1 F (36.7 C) (Temporal)  Ht 5\' 6"  (1.676 m)  Wt 163 lb 3.2 oz (74.027 kg)  BMI 26.34 kg/m2  LMP 07/20/2011 General:   Alert and oriented. Well-developed, well-nourished, pleasant and cooperative. Head:  Normocephalic and atraumatic. Eyes:  Conjunctiva pink, sclera clear, no icterus.    Ears:  Normal auditory acuity. Nose:  No deformity, discharge,  or lesions. Mouth:  No deformity or lesions, mucosa pink and moist.  Neck:  Supple, without mass or thyromegaly. Lungs:  Clear to auscultation bilaterally, without wheezing, rales, or rhonchi.  Heart:  S1, S2 present without murmurs noted.  Abdomen:  +BS, soft, TTP epigastric and RUQ,  and non-distended. Without mass or HSM. No rebound or guarding. No hernias noted. Rectal:  Deferred  Msk:  Symmetrical without gross deformities. Normal posture. Pulses:  Normal pulses noted. Extremities:  Without clubbing or edema. Neurologic:  Alert and  oriented x4;  grossly normal neurologically. Skin:  Intact, warm and dry without significant lesions or rashes Cervical Nodes:  No significant cervical adenopathy. Psych:  Alert and cooperative. Normal mood and affect.

## 2011-07-22 NOTE — Patient Instructions (Signed)
Start taking Miralax daily as needed for a bowel movement. This will help alleviate your constipation. Follow a high fiber diet.   We have set you up for an upper endoscopy and colonoscopy in the near future with Dr. Darrick Penna.   Continue to take Protonix daily.   Further recommendations to follow once these procedures are completed.

## 2011-07-24 NOTE — Progress Notes (Signed)
REVIEWED.  

## 2011-08-05 ENCOUNTER — Telehealth: Payer: Self-pay | Admitting: Gastroenterology

## 2011-08-05 NOTE — Telephone Encounter (Signed)
Called pt to follow up on scheduling her procedure- she is spoke with Lubertha Basque yesterday about her [facility]-  Pt should have an answer within a week and will call us to schedule

## 2011-08-25 ENCOUNTER — Ambulatory Visit: Payer: Self-pay | Admitting: Urgent Care

## 2011-08-27 ENCOUNTER — Ambulatory Visit: Payer: Self-pay | Admitting: Gastroenterology

## 2011-09-02 ENCOUNTER — Ambulatory Visit (INDEPENDENT_AMBULATORY_CARE_PROVIDER_SITE_OTHER): Payer: Self-pay | Admitting: Urgent Care

## 2011-09-02 ENCOUNTER — Encounter: Payer: Self-pay | Admitting: Urgent Care

## 2011-09-02 VITALS — BP 108/73 | HR 83 | Temp 98.3°F | Ht 64.0 in | Wt 167.2 lb

## 2011-09-02 DIAGNOSIS — R894 Abnormal immunological findings in specimens from other organs, systems and tissues: Secondary | ICD-10-CM

## 2011-09-02 DIAGNOSIS — K219 Gastro-esophageal reflux disease without esophagitis: Secondary | ICD-10-CM

## 2011-09-02 DIAGNOSIS — R768 Other specified abnormal immunological findings in serum: Secondary | ICD-10-CM

## 2011-09-02 DIAGNOSIS — K921 Melena: Secondary | ICD-10-CM

## 2011-09-02 DIAGNOSIS — R109 Unspecified abdominal pain: Secondary | ICD-10-CM

## 2011-09-02 NOTE — Progress Notes (Signed)
Referring Provider: Cassell Smiles., MD Primary Care Physician:  Cassell Smiles., MD Primary Gastroenterologist:  Dr. Darrick Penna  Chief Complaint  Patient presents with  . H&P   HPI:  Danielle Vazquez is a 41 y.o. female here to set up colonoscopy and EGD. She been seen by Florene Glen, NP in January 2013. She was scheduled to have a colonoscopy and EGD for hematochezia and refractory GERD, however do to lack of financial assistance, she has postponed procedures until now. He continues to have refractory GERD despite daily Protonix 40 mg. She has not seen any further hematochezia. She previously had one episode of small volume hematochezia. She denies any melena. She denies any anorexia. She denies any dysphagia or odynophagia. She denies any diarrhea or constipation. Her abdominal pain is somewhat improved. Previously found to be h pylori positive 3.50. Was going to be treated with a Prevpac, but too expensive, so she split up medications. Due to finances, did not take all concurrently. Completed all treatment January 2013.  Previous workup has included: LFTs normal in Dec 2012.  Hgb 15.5. No leukocytosis. Amylase and Lipase normal.  [place] abd Dec 2012: Normal CT IMPRESSION Jan 2013: 1. Mild circumferential wall thickening of a segment of the proximal to mid sigmoid colon with some prominence/injection of the surrounding sigmoid mesenteric vasculature. Findings could reflect infectious colitis or inflammatory bowel disease.  2. Small focal area of renal cortical scarring posterior left mid kidney. This suggests the possibility of prior urinary tract infection.  3. Cholecystectomy.   Past Medical History  Diagnosis Date  . Anxiety   . GERD (gastroesophageal reflux disease)   . Chronic pain syndrome   . DDD (degenerative disc disease)     has had injections in past   Past Surgical History  Procedure Date  . Cholecystectomy 6-7 years  . Tubal ligation    Current Outpatient Prescriptions    Medication Sig Dispense Refill  . ALPRAZolam (XANAX) 0.25 MG tablet Take 0.25 mg by mouth 4 (four) times daily.        . carbamazepine (TEGRETOL) 100 MG chewable tablet Chew by mouth 2 (two) times daily.        . cyclobenzaprine (FLEXERIL) 10 MG tablet Take 10 mg by mouth 3 (three) times daily as needed. For muscle spasms      . oxyCODONE (ROXICODONE) 15 MG immediate release tablet Take 15 mg by mouth every 4 (four) hours as needed. Five to six per day       . pantoprazole (PROTONIX) 40 MG tablet Take 40 mg by mouth daily.         Allergies as of 09/02/2011 - Review Complete 09/02/2011  Allergen Reaction Noted  . Codeine Nausea Only and Rash 09/09/2010   Family History:There is no known family history of colorectal carcinoma , liver disease, or inflammatory bowel disease.  Problem Relation Age of Onset  . Heart attack Mother   . Heart attack Father   . Colon cancer Neg Hx    History   Social History  . Marital Status: Single    Spouse Name: N/A    Number of Children: 3  . Years of Education: N/A   Occupational History  . unemployed    Social History Main Topics  . Smoking status: Current Everyday Smoker -- 1.0 packs/day for 25 years    Types: Cigarettes  . Smokeless tobacco: Not on file  . Alcohol Use: No  . Drug Use: No  . Sexually Active: Yes  Birth Control/ Protection: Surgical, Other-see comments  Review of Systems: Gen: Denies any fever, chills, sweats, anorexia, fatigue, weakness, malaise, weight loss, and sleep disorder CV: Denies chest pain, angina, palpitations, syncope, orthopnea, PND, peripheral edema, and claudication. Resp: Denies dyspnea at rest, dyspnea with exercise, cough, sputum, wheezing, coughing up blood, and pleurisy. GI: Denies vomiting blood, jaundice, and fecal incontinence.  GU : Denies urinary burning, blood in urine, urinary frequency, urinary hesitancy, nocturnal urination, and urinary incontinence. MS: Denies joint pain, limitation of  movement, and swelling, stiffness, low back pain, extremity pain. Denies muscle weakness, cramps, atrophy.  Derm: Denies rash, itching, dry skin, hives, moles, warts, or unhealing ulcers.  Psych: Denies depression, anxiety, memory loss, suicidal ideation, hallucinations, paranoia, and confusion. Heme: Denies bruising, bleeding, and enlarged lymph nodes. Neuro:  Denies any headaches, dizziness, paresthesias. Endo:  Denies any problems with DM, thyroid, adrenal function.  Physical Exam: BP 108/73  Pulse 83  Temp(Src) 98.3 F (36.8 C) (Temporal)  Ht 5\' 4"  (1.626 m)  Wt 167 lb 3.2 oz (75.841 kg)  BMI 28.70 kg/m2  LMP 08/18/2011 General:   Alert,  Well-developed, well-nourished, pleasant and cooperative in NAD Head:  Normocephalic and atraumatic. Eyes:  Sclera clear, no icterus.   Conjunctiva pink. Ears:  Normal auditory acuity. Nose:  No deformity, discharge, or lesions. Mouth:  No deformity or lesions,oropharynx pink & moist. Neck:  Supple; no masses or thyromegaly. Lungs:  Clear throughout to auscultation.   No wheezes, crackles, or rhonchi. No acute distress. Heart:  Regular rate and rhythm; no murmurs, clicks, rubs,  or gallops. Abdomen:  Normal bowel sounds.  No bruits.  Soft, non-tender and non-distended without masses, hepatosplenomegaly or hernias noted.  No guarding or rebound tenderness.   Rectal:  Deferred until time of colonoscopy. Msk:  Symmetrical without gross deformities. Normal posture. Pulses:  Normal pulses noted. Extremities:  No clubbing or edema. Neurologic:  Alert and oriented x4;  grossly normal neurologically. Skin:  Intact without significant lesions or rashes. Lymph Nodes:  No significant cervical adenopathy. Psych:  Alert and cooperative. Normal mood and affect.

## 2011-09-02 NOTE — Patient Instructions (Signed)
Colonoscopy and EGD with Dr. Darrick Penna Continue Protonix daily

## 2011-09-03 DIAGNOSIS — K219 Gastro-esophageal reflux disease without esophagitis: Secondary | ICD-10-CM | POA: Insufficient documentation

## 2011-09-03 NOTE — Progress Notes (Signed)
Faxed to PCP

## 2011-09-03 NOTE — Assessment & Plan Note (Signed)
Refractory.  See abdominal pain. Protonix 40 mg daily

## 2011-09-03 NOTE — Assessment & Plan Note (Signed)
Intermittent epigastric and upper abdominal pain has resolved. She is no longer having nausea and vomiting. She does have GERD. History of H. pylori status post treatment, but her medications were not taking at the same time.  At some point would consider H. pylori stool antigen to verify eradication.  EGD for new onset dyspepsia and refractory GERD.  I have discussed risks & benefits which include, but are not limited to, bleeding, infection, perforation & drug reaction.  The patient agrees with this plan & written consent will be obtained.    Procedures will need to be done with deep sedation (propofol) in the OR under the direction of anesthesia services for polypharmacy.

## 2011-09-03 NOTE — Assessment & Plan Note (Signed)
see "abdominal pain"

## 2011-09-03 NOTE — Assessment & Plan Note (Signed)
Colonoscopy near future further evaluation.  I have discussed risks & benefits which include, but are not limited to, bleeding, infection, perforation & drug reaction.  The patient agrees with this plan & written consent will be obtained.

## 2011-09-11 ENCOUNTER — Encounter (HOSPITAL_COMMUNITY): Payer: Self-pay | Admitting: Pharmacy Technician

## 2011-09-16 ENCOUNTER — Encounter (HOSPITAL_COMMUNITY): Admission: RE | Admit: 2011-09-16 | Discharge: 2011-09-16 | Payer: Self-pay | Source: Ambulatory Visit

## 2011-09-22 ENCOUNTER — Encounter (HOSPITAL_COMMUNITY)
Admission: RE | Admit: 2011-09-22 | Discharge: 2011-09-22 | Disposition: A | Discharge status: Home / Self Care | Payer: Self-pay | Source: Ambulatory Visit | Attending: Gastroenterology | Admitting: Gastroenterology

## 2011-09-22 ENCOUNTER — Encounter (HOSPITAL_COMMUNITY): Payer: Self-pay

## 2011-09-22 HISTORY — PX: ESOPHAGOGASTRODUODENOSCOPY: SHX1529

## 2011-09-22 HISTORY — DX: Reserved for concepts with insufficient information to code with codable children: IMO0002

## 2011-09-22 LAB — BASIC METABOLIC PANEL
Calcium: 8.6 mg/dL (ref 8.4–10.5)
GFR calc Af Amer: >90 mL/min (ref >90)
GFR calc non Af Amer: 78 mL/min — ABNORMAL LOW (ref >90)
Glucose, Bld: 83 mg/dL (ref 70–99)
Potassium: 4.2 mEq/L (ref 3.5–5.1)
Sodium: 138 mEq/L (ref 135–145)

## 2011-09-22 LAB — HEMOGLOBIN AND HEMATOCRIT, BLOOD
HCT: 43.2 % (ref 36.0–46.0)
Hemoglobin: 14.4 g/dL (ref 12.0–15.0)

## 2011-09-22 LAB — HCG, SERUM, QUALITATIVE: Preg, Serum: NEGATIVE

## 2011-09-22 NOTE — Patient Instructions (Addendum)
C20 Danielle Vazquez  09/22/2011   Your procedure is scheduled on:   09/23/2011  Report to [facility] at  0700 AM.  Call this number if you have problems the morning of surgery: [phone]   Remember:   Do not eat food:After Midnight.  May have clear liquids:until Midnight .  Clear liquids include soda, tea, black coffee, apple or grape juice, broth.  Take these medicines the morning of surgery with A SIP OF WATER:  Xananx, tegretol,zofran,oxycodone,protonix   Do not wear jewelry, make-up or nail polish.  Do not wear lotions, powders, or perfumes. You may wear deodorant.  Do not shave 48 hours prior to surgery.  Do not bring valuables to the hospital.  Contacts, dentures or bridgework may not be worn into surgery.  Leave suitcase in the car. After surgery it may be brought to your room.  For patients admitted to the hospital, checkout time is 11:00 AM the day of discharge.   Patients discharged the day of surgery will not be allowed to drive home.  Name and phone number of your driver: family  Special Instructions: N/A   Please read over the following fact sheets that you were given: Pain Booklet, MRSA Information, Surgical Site Infection Prevention, Anesthesia Post-op Instructions and Care and Recovery After Surgery Esophagogastroduodenoscopy This is an endoscopic procedure (a procedure that uses a device like a flexible telescope) that allows your caregiver to view the upper stomach and small bowel. This test allows your caregiver to look at the esophagus. The esophagus carries food from your mouth to your stomach. They can also look at your duodenum. This is the first part of the small intestine that attaches to the stomach. This test is used to detect problems in the bowel such as ulcers and inflammation. PREPARATION FOR TEST Nothing to eat after midnight the day before the test. NORMAL FINDINGS Normal esophagus, stomach, and duodenum. Ranges for normal findings may vary among different  laboratories and hospitals. You should always check with your doctor after having lab work or other tests done to discuss the meaning of your test results and whether your values are considered within normal limits. MEANING OF TEST  Your caregiver will go over the test results with you and discuss the importance and meaning of your results, as well as treatment options and the need for additional tests if necessary. OBTAINING THE TEST RESULTS It is your responsibility to obtain your test results. Ask the lab or department performing the test when and how you will get your results. Document Released: 10/10/2004 Document Revised: 05/29/2011 Document Reviewed: 05/19/2008 [facility] Patient Information 2012 [place], [place].olonoscopy A colonoscopy is an exam to evaluate your entire colon. In this exam, your colon is cleansed. A long fiberoptic tube is inserted through your rectum and into your colon. The fiberoptic scope (endoscope) is a long bundle of enclosed and very flexible fibers. These fibers transmit light to the area examined and send images from that area to your caregiver. Discomfort is usually minimal. You may be given a drug to help you sleep (sedative) during or prior to the procedure. This exam helps to detect lumps (tumors), polyps, inflammation, and areas of bleeding. Your caregiver may also take a small piece of tissue (biopsy) that will be examined under a microscope. LET YOUR CAREGIVER KNOW ABOUT:   Allergies to food or medicine.   Medicines taken, including vitamins, herbs, eyedrops, over-the-counter medicines, and creams.   Use of steroids (by mouth or creams).   Previous  problems with anesthetics or numbing medicines.   History of bleeding problems or blood clots.   Previous surgery.   Other health problems, including diabetes and kidney problems.   Possibility of pregnancy, if this applies.  BEFORE THE PROCEDURE   A clear liquid diet may be required for 2 days before the  exam.   Ask your caregiver about changing or stopping your regular medications.   Liquid injections (enemas) or laxatives may be required.   A large amount of electrolyte solution may be given to you to drink over a short period of time. This solution is used to clean out your colon.   You should be present 60 minutes prior to your procedure or as directed by your caregiver.  AFTER THE PROCEDURE   If you received a sedative or pain relieving medication, you will need to arrange for someone to drive you home.   Occasionally, there is a little blood passed with the first bowel movement. Do not be concerned.  FINDING OUT THE RESULTS OF YOUR TEST Not all test results are available during your visit. If your test results are not back during the visit, make an appointment with your caregiver to find out the results. Do not assume everything is normal if you have not heard from your caregiver or the medical facility. It is important for you to follow up on all of your test results. HOME CARE INSTRUCTIONS   It is not unusual to pass moderate amounts of gas and experience mild abdominal cramping following the procedure. This is due to air being used to inflate your colon during the exam. Walking or a warm pack on your belly (abdomen) may help.   You may resume all normal meals and activities after sedatives and medicines have worn off.   Only take over-the-counter or prescription medicines for pain, discomfort, or fever as directed by your caregiver. Do not use aspirin or blood thinners if a biopsy was taken. Consult your caregiver for medicine usage if biopsies were taken.  SEEK IMMEDIATE MEDICAL CARE IF:   You have a fever.   You pass large blood clots or fill a toilet with blood following the procedure. This may also occur 10 to 14 days following the procedure. This is more likely if a biopsy was taken.   You develop abdominal pain that keeps getting worse and cannot be relieved with medicine.   Document Released: 06/06/2000 Document Revised: 05/29/2011 Document Reviewed: 01/20/2008 [facility] Patient Information 2012 [place], [place].PATIENT INSTRUCTIONS POST-ANESTHESIA  IMMEDIATELY FOLLOWING SURGERY:  Do not drive or operate machinery for the first twenty four hours after surgery.  Do not make any important decisions for twenty four hours after surgery or while taking narcotic pain medications or sedatives.  If you develop intractable nausea and vomiting or a severe headache please notify your doctor immediately.  FOLLOW-UP:  Please make an appointment with your surgeon as instructed. You do not need to follow up with anesthesia unless specifically instructed to do so.  WOUND CARE INSTRUCTIONS (if applicable):  Keep a dry clean dressing on the anesthesia/puncture wound site if there is drainage.  Once the wound has quit draining you may leave it open to air.  Generally you should leave the bandage intact for twenty four hours unless there is drainage.  If the epidural site drains for more than 36-48 hours please call the anesthesia department.  QUESTIONS?:  Please feel free to call your physician or the hospital operator if you have any questions, and  they will be happy to assist you.     [facility] Anesthesia Department [address] [place] [phone]

## 2011-09-23 ENCOUNTER — Encounter (HOSPITAL_COMMUNITY): Payer: Self-pay | Admitting: *Deleted

## 2011-09-23 ENCOUNTER — Encounter (HOSPITAL_COMMUNITY): Payer: Self-pay | Admitting: Anesthesiology

## 2011-09-23 ENCOUNTER — Ambulatory Visit (HOSPITAL_COMMUNITY)
Admission: RE | Admit: 2011-09-23 | Discharge: 2011-09-23 | Disposition: A | Discharge status: Home / Self Care | Payer: Self-pay | Source: Ambulatory Visit | Attending: Gastroenterology | Admitting: Gastroenterology

## 2011-09-23 ENCOUNTER — Encounter (HOSPITAL_COMMUNITY)
Admission: RE | Disposition: A | Discharge status: Home / Self Care | Payer: Self-pay | Source: Ambulatory Visit | Attending: Gastroenterology

## 2011-09-23 ENCOUNTER — Ambulatory Visit (HOSPITAL_COMMUNITY): Payer: Self-pay | Admitting: Anesthesiology

## 2011-09-23 DIAGNOSIS — K2970 Gastritis, unspecified, without bleeding: Secondary | ICD-10-CM | POA: Insufficient documentation

## 2011-09-23 DIAGNOSIS — D129 Benign neoplasm of anus and anal canal: Secondary | ICD-10-CM | POA: Insufficient documentation

## 2011-09-23 DIAGNOSIS — K222 Esophageal obstruction: Secondary | ICD-10-CM

## 2011-09-23 DIAGNOSIS — K2990 Gastroduodenitis, unspecified, without bleeding: Secondary | ICD-10-CM

## 2011-09-23 DIAGNOSIS — K648 Other hemorrhoids: Secondary | ICD-10-CM | POA: Insufficient documentation

## 2011-09-23 DIAGNOSIS — D128 Benign neoplasm of rectum: Secondary | ICD-10-CM | POA: Insufficient documentation

## 2011-09-23 DIAGNOSIS — K620 Anal polyp: Secondary | ICD-10-CM

## 2011-09-23 DIAGNOSIS — K621 Rectal polyp: Secondary | ICD-10-CM

## 2011-09-23 DIAGNOSIS — D126 Benign neoplasm of colon, unspecified: Secondary | ICD-10-CM | POA: Insufficient documentation

## 2011-09-23 DIAGNOSIS — K449 Diaphragmatic hernia without obstruction or gangrene: Secondary | ICD-10-CM | POA: Insufficient documentation

## 2011-09-23 DIAGNOSIS — K5730 Diverticulosis of large intestine without perforation or abscess without bleeding: Secondary | ICD-10-CM | POA: Insufficient documentation

## 2011-09-23 DIAGNOSIS — K625 Hemorrhage of anus and rectum: Secondary | ICD-10-CM

## 2011-09-23 DIAGNOSIS — R1310 Dysphagia, unspecified: Secondary | ICD-10-CM | POA: Insufficient documentation

## 2011-09-23 HISTORY — PX: OTHER SURGICAL HISTORY: SHX169

## 2011-09-23 SURGERY — COLONOSCOPY WITH PROPOFOL
Anesthesia: Monitor Anesthesia Care | Site: Rectum

## 2011-09-23 MED ORDER — GLYCOPYRROLATE 0.2 MG/ML IJ SOLN
INTRAMUSCULAR | Status: AC
  Filled 2011-09-23: qty 1

## 2011-09-23 MED ORDER — ONDANSETRON HCL 4 MG/2ML IJ SOLN: 4.0000 mg | Freq: Once | INTRAMUSCULAR | Status: DC | PRN

## 2011-09-23 MED ORDER — MIDAZOLAM HCL 5 MG/5ML IJ SOLN
INTRAMUSCULAR | Status: DC | PRN
  Administered 2011-09-23: 2 mg via INTRAVENOUS
  Administered 2011-09-23 (×2): 1 mg via INTRAVENOUS

## 2011-09-23 MED ORDER — MIDAZOLAM HCL 2 MG/2ML IJ SOLN
INTRAMUSCULAR | Status: AC
  Filled 2011-09-23: qty 2

## 2011-09-23 MED ORDER — PROPOFOL 10 MG/ML IV EMUL
INTRAVENOUS | Status: AC
  Filled 2011-09-23: qty 20

## 2011-09-23 MED ORDER — MINERAL OIL PO OIL
TOPICAL_OIL | ORAL | Status: AC
  Filled 2011-09-23: qty 30

## 2011-09-23 MED ORDER — ONDANSETRON HCL 4 MG/2ML IJ SOLN
4.0000 mg | Freq: Once | INTRAMUSCULAR | Status: AC
  Administered 2011-09-23: 4 mg via INTRAVENOUS

## 2011-09-23 MED ORDER — FENTANYL CITRATE 0.05 MG/ML IJ SOLN: 25.0000 ug | INTRAMUSCULAR | Status: DC | PRN

## 2011-09-23 MED ORDER — LIDOCAINE HCL 1 % IJ SOLN
INTRAMUSCULAR | Status: DC | PRN
  Administered 2011-09-23: 20 mg via INTRADERMAL

## 2011-09-23 MED ORDER — PROPOFOL 10 MG/ML IV EMUL
INTRAVENOUS | Status: DC | PRN
  Administered 2011-09-23: 55 ug/kg/min via INTRAVENOUS

## 2011-09-23 MED ORDER — STERILE WATER FOR IRRIGATION IR SOLN
Status: DC | PRN
  Administered 2011-09-23: 09:00:00

## 2011-09-23 MED ORDER — LACTATED RINGERS IV SOLN
INTRAVENOUS | Status: DC
  Administered 2011-09-23: 1000 mL via INTRAVENOUS

## 2011-09-23 MED ORDER — ONDANSETRON HCL 4 MG/2ML IJ SOLN
INTRAMUSCULAR | Status: AC
  Administered 2011-09-23: 4 mg via INTRAVENOUS
  Filled 2011-09-23: qty 2

## 2011-09-23 MED ORDER — GLYCOPYRROLATE 0.2 MG/ML IJ SOLN
0.2000 mg | Freq: Once | INTRAMUSCULAR | Status: AC
  Administered 2011-09-23: 0.2 mg via INTRAVENOUS

## 2011-09-23 MED ORDER — MIDAZOLAM HCL 2 MG/2ML IJ SOLN
1.0000 mg | INTRAMUSCULAR | Status: DC | PRN
  Administered 2011-09-23: 2 mg via INTRAVENOUS

## 2011-09-23 SURGICAL SUPPLY — 23 items
BLOCK BITE 60FR ADLT L/F BLUE (MISCELLANEOUS) ×4 IMPLANT
ELECT REM PT RETURN 9FT ADLT (ELECTROSURGICAL) ×4
ELECTRODE REM PT RTRN 9FT ADLT (ELECTROSURGICAL) ×3 IMPLANT
FCP BXJMBJMB 240X2.8X (CUTTING FORCEPS)
FLOOR PAD 36X40 (MISCELLANEOUS) ×4
FORCEP RJ3 GP 1.8X160 W-NEEDLE (CUTTING FORCEPS) IMPLANT
FORCEPS BIOP RAD 4 LRG CAP 4 (CUTTING FORCEPS) ×4 IMPLANT
FORCEPS BIOP RJ4 240 W/NDL (CUTTING FORCEPS)
FORCEPS BXJMBJMB 240X2.8X (CUTTING FORCEPS) IMPLANT
INJECTOR/SNARE I SNARE (MISCELLANEOUS) IMPLANT
LUBRICANT JELLY 4.5OZ STERILE (MISCELLANEOUS) ×4 IMPLANT
MANIFOLD NEPTUNE II (INSTRUMENTS) ×4 IMPLANT
NEEDLE SCLEROTHERAPY 25GX240 (NEEDLE) IMPLANT
PAD FLOOR 36X40 (MISCELLANEOUS) ×3 IMPLANT
PROBE APC STR FIRE (PROBE) IMPLANT
PROBE INJECTION GOLD (MISCELLANEOUS)
PROBE INJECTION GOLD 7FR (MISCELLANEOUS) IMPLANT
SENSATION SHORT THROW SMALL OVAL ×4 IMPLANT
SYR 50ML LL SCALE MARK (SYRINGE) ×4 IMPLANT
TRAP SPECIMEN MUCOUS 40CC (MISCELLANEOUS) ×4 IMPLANT
TUBING ENDO SMARTCAP PENTAX (MISCELLANEOUS) ×4 IMPLANT
TUBING IRRIGATION ENDOGATOR (MISCELLANEOUS) ×4 IMPLANT
WATER STERILE IRR 1000ML POUR (IV SOLUTION) ×4 IMPLANT

## 2011-09-23 NOTE — H&P (View-Only) (Signed)
Referring Provider: Cassell Smiles., MD Primary Care Physician:  Cassell Smiles., MD Primary Gastroenterologist:  Dr. Darrick Penna  Chief Complaint  Patient presents with  . H&P   HPI:  Danielle Vazquez is a 41 y.o. female here to set up colonoscopy and EGD. She been seen by Florene Glen, NP in January 2013. She was scheduled to have a colonoscopy and EGD for hematochezia and refractory GERD, however do to lack of financial assistance, she has postponed procedures until now. He continues to have refractory GERD despite daily Protonix 40 mg. She has not seen any further hematochezia. She previously had one episode of small volume hematochezia. She denies any melena. She denies any anorexia. She denies any dysphagia or odynophagia. She denies any diarrhea or constipation. Her abdominal pain is somewhat improved. Previously found to be h pylori positive 3.50. Was going to be treated with a Prevpac, but too expensive, so she split up medications. Due to finances, did not take all concurrently. Completed all treatment January 2013.  Previous workup has included: LFTs normal in Dec 2012.  Hgb 15.5. No leukocytosis. Amylase and Lipase normal.  [place] abd Dec 2012: Normal CT IMPRESSION Jan 2013: 1. Mild circumferential wall thickening of a segment of the proximal to mid sigmoid colon with some prominence/injection of the surrounding sigmoid mesenteric vasculature. Findings could reflect infectious colitis or inflammatory bowel disease.  2. Small focal area of renal cortical scarring posterior left mid kidney. This suggests the possibility of prior urinary tract infection.  3. Cholecystectomy.   Past Medical History  Diagnosis Date  . Anxiety   . GERD (gastroesophageal reflux disease)   . Chronic pain syndrome   . DDD (degenerative disc disease)     has had injections in past   Past Surgical History  Procedure Date  . Cholecystectomy 6-7 years  . Tubal ligation    Current Outpatient Prescriptions    Medication Sig Dispense Refill  . ALPRAZolam (XANAX) 0.25 MG tablet Take 0.25 mg by mouth 4 (four) times daily.        . carbamazepine (TEGRETOL) 100 MG chewable tablet Chew by mouth 2 (two) times daily.        . cyclobenzaprine (FLEXERIL) 10 MG tablet Take 10 mg by mouth 3 (three) times daily as needed. For muscle spasms      . oxyCODONE (ROXICODONE) 15 MG immediate release tablet Take 15 mg by mouth every 4 (four) hours as needed. Five to six per day       . pantoprazole (PROTONIX) 40 MG tablet Take 40 mg by mouth daily.         Allergies as of 09/02/2011 - Review Complete 09/02/2011  Allergen Reaction Noted  . Codeine Nausea Only and Rash 09/09/2010   Family History:There is no known family history of colorectal carcinoma , liver disease, or inflammatory bowel disease.  Problem Relation Age of Onset  . Heart attack Mother   . Heart attack Father   . Colon cancer Neg Hx    History   Social History  . Marital Status: Single    Spouse Name: N/A    Number of Children: 3  . Years of Education: N/A   Occupational History  . unemployed    Social History Main Topics  . Smoking status: Current Everyday Smoker -- 1.0 packs/day for 25 years    Types: Cigarettes  . Smokeless tobacco: Not on file  . Alcohol Use: No  . Drug Use: No  . Sexually Active: Yes  Birth Control/ Protection: Surgical, Other-see comments  Review of Systems: Gen: Denies any fever, chills, sweats, anorexia, fatigue, weakness, malaise, weight loss, and sleep disorder CV: Denies chest pain, angina, palpitations, syncope, orthopnea, PND, peripheral edema, and claudication. Resp: Denies dyspnea at rest, dyspnea with exercise, cough, sputum, wheezing, coughing up blood, and pleurisy. GI: Denies vomiting blood, jaundice, and fecal incontinence.  GU : Denies urinary burning, blood in urine, urinary frequency, urinary hesitancy, nocturnal urination, and urinary incontinence. MS: Denies joint pain, limitation of  movement, and swelling, stiffness, low back pain, extremity pain. Denies muscle weakness, cramps, atrophy.  Derm: Denies rash, itching, dry skin, hives, moles, warts, or unhealing ulcers.  Psych: Denies depression, anxiety, memory loss, suicidal ideation, hallucinations, paranoia, and confusion. Heme: Denies bruising, bleeding, and enlarged lymph nodes. Neuro:  Denies any headaches, dizziness, paresthesias. Endo:  Denies any problems with DM, thyroid, adrenal function.  Physical Exam: BP 108/73  Pulse 83  Temp(Src) 98.3 F (36.8 C) (Temporal)  Ht 5\' 4"  (1.626 m)  Wt 167 lb 3.2 oz (75.841 kg)  BMI 28.70 kg/m2  LMP 08/18/2011 General:   Alert,  Well-developed, well-nourished, pleasant and cooperative in NAD Head:  Normocephalic and atraumatic. Eyes:  Sclera clear, no icterus.   Conjunctiva pink. Ears:  Normal auditory acuity. Nose:  No deformity, discharge, or lesions. Mouth:  No deformity or lesions,oropharynx pink & moist. Neck:  Supple; no masses or thyromegaly. Lungs:  Clear throughout to auscultation.   No wheezes, crackles, or rhonchi. No acute distress. Heart:  Regular rate and rhythm; no murmurs, clicks, rubs,  or gallops. Abdomen:  Normal bowel sounds.  No bruits.  Soft, non-tender and non-distended without masses, hepatosplenomegaly or hernias noted.  No guarding or rebound tenderness.   Rectal:  Deferred until time of colonoscopy. Msk:  Symmetrical without gross deformities. Normal posture. Pulses:  Normal pulses noted. Extremities:  No clubbing or edema. Neurologic:  Alert and oriented x4;  grossly normal neurologically. Skin:  Intact without significant lesions or rashes. Lymph Nodes:  No significant cervical adenopathy. Psych:  Alert and cooperative. Normal mood and affect.

## 2011-09-23 NOTE — Op Note (Signed)
[facility] [address] [place], [place]  [address]  DR. FIELD'S TCS EGD PROCEDURE REPORT  PATIENT:  Danielle Vazquez, Danielle Vazquez  MR#:  [id number] BIRTHDATE:  03-May-1971, 41 yrs. old  GENDER:  female  ENDOSCOPIST:  Jonette Eva, MD REF. BY:  Artis Delay, M.D. ASSISTANT:  PROCEDURE DATE:  09/23/2011 PROCEDURE:  ILEOColonoscopy with biopsy and snare polypectomy, EGD with biopsy & SAVARY DILATION  INDICATIONS:  RECTAL BLEEDING, DYSPHAGIA  MEDICATIONS:   MAC sedation, administered by CRNA  DESCRIPTION OF PROCEDURE:    Physical exam was performed. Informed consent was obtained from the patient after explaining the benefits, risks, and alternatives to procedure.  The patient was connected to monitor and placed in left lateral position. Continuous oxygen was provided by nasal cannula and IV medicine administered through an indwelling cannula.  After administration of sedation, the patient's esophagus was intubated and the endoscope was advanced under direct visualization to the second portion of the duodenum.  The scope was removed slowly by carefully examining the color, texture, anatomy, and integrity of the mucosa on the way out.  After administration of sedation and rectal exam, the patient's rectum was intubated and the  colonoscope was advanced under direct visualization to the cILEUM. The scope was removed slowly by carefully examining the color, texture, anatomy, and integrity mucosa on the way out.  The patient was recovered in endoscopy and discharged home in satisfactory condition. <<PROCEDUREIMAGES>>  FINDINGS:   ONE 6 MM SESSILE polyp identified and removed. in the rectum VIA SNARE CAUTERY.  ONE 4 MM SESSILE polyp identified and removed. in the distal transverse colon VIA COLD FORCEPS.  There were mild diverticular changes in left colon. SMALL  Internal Hemorrhoids were found. DISTAL ESOPHAGEAL STRICTURE NARROWING THE LUMEN TO 12-13 MM. DILATED FROM 12.8 TO 16 MM. THE  DILATORS PASSED WITH MILD TO MODERATE RESITANCE. DIFFUSE ERYTHEMA AND EDEMA W/O ULCERS SEEN IN THE STOMACH. NL DUODENUM. NO BARRETT'S PREP QUALITY: FAIR TO GOOD AFTER ASPIRATION/IRRIGATION CECAL W/D TIME:    30 minutes  COMPLICATIONS:    None  ENDOSCOPIC IMPRESSION: 1) Polyps, multiple in the rectum 2) Polyps, multiple in the distal transverse colon 3) Diverticulosis,mild,left sided diverticulosis 4) Internal hemorrhoids 5) ESOPHAGEAL STRICTURE 6) GASTRITIS 7) HIATAL HERNIA  RECOMMENDATIONS: PROTNIX QAC BREAKFAST AWAIT BIOPSIES LOW FAT/HIGH FIBER DIET NO ASA FOR 2 WEEKS TCS IN 5 YEARS WIH 36 HOUR OF CLEAR LIQUID DIET  REPEAT EXAM:  No  ______________________________ Jonette Eva, MD  CC:  Artis Delay, M.D.  n. eSIGNED:   Aleah Ahlgrim at 09/23/2011 10:16 AM  Marciano Sequin, [phone]

## 2011-09-23 NOTE — Discharge Instructions (Signed)
YOUR RECTAL BLEEDING IS FROM POLYPS AND HEMORRHOIDS. You had 2 polyps removed. You have internal hemorrhoids and diverticulosis. I dilated your esophagus TO ADDRESS YOUR PROBLEM SWALLOWING & THE FEELING LIKE SOMETHING IS SITTING IN YOUR CHEST. You have gastritis, most likely due to your use of ASPIRIN. I biopsied your stomach TO SEE IF THE H PYLORI IS GONE.  CONTINUE PROTONIX EVERY MORNING. TAKE 30 MINUTES PRIOR TO BREAKFAST. AVOID ASPIRIN FOR 2 WEEKS. USE TYLENOL AS NEEDED FOR PAIN. Next colonoscopy in 5 years. FOLLOW A HIGH FIBER/LOW FAT DIET. AVOID ITEMS THAT CAUSE BLOATING. SEE INFO BELOW. YOUR BIOPSY RESULTS SHOULD BE BACK IN 7-10 DAYS.  ENDOSCOPY Care After Read the instructions outlined below and refer to this sheet in the next week. These discharge instructions provide you with general information on caring for yourself after you leave the hospital. While your treatment has been planned according to the most current medical practices available, unavoidable complications occasionally occur. If you have any problems or questions after discharge, call DR. Suleima Ohlendorf, [phone].  ACTIVITY  You may resume your regular activity, but move at a slower pace for the next 24 hours.   Take frequent rest periods for the next 24 hours.   Walking will help get rid of the air and reduce the bloated feeling in your belly (abdomen).   No driving for 24 hours (because of the medicine (anesthesia) used during the test).   You may shower.   Do not sign any important legal documents or operate any machinery for 24 hours (because of the anesthesia used during the test).    NUTRITION  Drink plenty of fluids.   You may resume your normal diet as instructed by your doctor.   Begin with a light meal and progress to your normal diet. Heavy or fried foods are harder to digest and may make you feel sick to your stomach (nauseated).   Avoid alcoholic beverages for 24 hours or as instructed.     MEDICATIONS  You may resume your normal medications.   WHAT YOU CAN EXPECT TODAY  Some feelings of bloating in the abdomen.   Passage of more gas than usual.   Spotting of blood in your stool or on the toilet paper  .  IF YOU HAD POLYPS REMOVED DURING THE ENDOSCOPY:  Eat a soft diet IF YOU HAVE NAUSEA, BLOATING, ABDOMINAL PAIN, OR VOMITING.    FINDING OUT THE RESULTS OF YOUR TEST Not all test results are available during your visit. DR. Darrick Penna WILL CALL YOU WITHIN 7 DAYS OF YOUR PROCEDUE WITH YOUR RESULTS. Do not assume everything is normal if you have not heard from DR. Willow Reczek IN ONE WEEK, CALL HER OFFICE AT [phone].  SEEK IMMEDIATE MEDICAL ATTENTION AND CALL THE OFFICE: [phone] IF:  You have more than a spotting of blood in your stool.   Your belly is swollen (abdominal distention).   You are nauseated or vomiting.   You have a temperature over 101F.   You have abdominal pain or discomfort that is severe or gets worse throughout the day.  Gastritis  Gastritis is an inflammation (the body's way of reacting to injury and/or infection) of the stomach. It is often caused by viral or bacterial (germ) infections. It can also be caused BY ASPIRIN, BC/GOODY POWDER'S, (IBUPROFEN) MOTRIN, OR ALEVE (NAPROXEN), chemicals (including alcohol), SPICY FOODS, and medications. This illness may be associated with generalized malaise (feeling tired, not well), UPPER ABDOMINAL STOMACH cramps, and fever. One common bacterial cause of  gastritis is an organism known as H. Pylori. This can be treated with antibiotics.    Polyps, Colon  A polyp is extra tissue that grows inside your body. Colon polyps grow in the large intestine. The large intestine, also called the colon, is part of your digestive system. It is a long, hollow tube at the end of your digestive tract where your body makes and stores stool. Most polyps are not dangerous. They are benign. This means they are not  cancerous. But over time, some types of polyps can turn into cancer. Polyps that are smaller than a pea are usually not harmful. But larger polyps could someday become or may already be cancerous. To be safe, doctors remove all polyps and test them.   WHO GETS POLYPS? Anyone can get polyps, but certain people are more likely than others. You may have a greater chance of getting polyps if:  You are over 50.   You have had polyps before.   Someone in your family has had polyps.   Someone in your family has had cancer of the large intestine.   Find out if someone in your family has had polyps. You may also be more likely to get polyps if you:   Eat a lot of fatty foods   Smoke   Drink alcohol   Do not exercise  Eat too much   TREATMENT  The caregiver will remove the polyp during sigmoidoscopy or colonoscopy.  PREVENTION There is not one sure way to prevent polyps. You might be able to lower your risk of getting them if you:  Eat more fruits and vegetables and less fatty food.   Do not smoke.   Avoid alcohol.   Exercise every day.   Lose weight if you are overweight.   Eating more calcium and folate can also lower your risk of getting polyps. Some foods that are rich in calcium are milk, cheese, and broccoli. Some foods that are rich in folate are chickpeas, kidney beans, and spinach.   High-Fiber Diet A high-fiber diet changes your normal diet to include more whole grains, legumes, fruits, and vegetables. Changes in the diet involve replacing refined carbohydrates with unrefined foods. The calorie level of the diet is essentially unchanged. The Dietary Reference Intake (recommended amount) for adult males is 38 grams per day. For adult females, it is 25 grams per day. Pregnant and lactating women should consume 28 grams of fiber per day. Fiber is the intact part of a plant that is not broken down during digestion. Functional fiber is fiber that has been isolated from the plant  to provide a beneficial effect in the body. PURPOSE  Increase stool bulk.   Ease and regulate bowel movements.   Lower cholesterol.  INDICATIONS THAT YOU NEED MORE FIBER  Constipation and hemorrhoids.   Uncomplicated diverticulosis (intestine condition) and irritable bowel syndrome.   Weight management.   As a protective measure against hardening of the arteries (atherosclerosis), diabetes, and cancer.   GUIDELINES FOR INCREASING FIBER IN THE DIET  Start adding fiber to the diet slowly. A gradual increase of about 5 more grams (2 slices of whole-wheat bread, 2 servings of most fruits or vegetables, or 1 bowl of high-fiber cereal) per day is best. Too rapid an increase in fiber may result in constipation, flatulence, and bloating.   Drink enough water and fluids to keep your urine clear or pale yellow. Water, juice, or caffeine-free drinks are recommended. Not drinking enough fluid may cause  constipation.   Eat a variety of high-fiber foods rather than one type of fiber.   Try to increase your intake of fiber through using high-fiber foods rather than fiber pills or supplements that contain small amounts of fiber.   The goal is to change the types of food eaten. Do not supplement your present diet with high-fiber foods, but replace foods in your present diet.  INCLUDE A VARIETY OF FIBER SOURCES  Replace refined and processed grains with whole grains, canned fruits with fresh fruits, and incorporate other fiber sources. White rice, white breads, and most bakery goods contain little or no fiber.   Brown whole-grain rice, buckwheat oats, and many fruits and vegetables are all good sources of fiber. These include: broccoli, Brussels sprouts, cabbage, cauliflower, beets, sweet potatoes, white potatoes (skin on), carrots, tomatoes, eggplant, squash, berries, fresh fruits, and dried fruits.   Cereals appear to be the richest source of fiber. Cereal fiber is found in whole grains and bran.  Bran is the fiber-rich outer coat of cereal grain, which is largely removed in refining. In whole-grain cereals, the bran remains. In breakfast cereals, the largest amount of fiber is found in those with "bran" in their names. The fiber content is sometimes indicated on the label.   You may need to include additional fruits and vegetables each day.   In baking, for 1 cup white flour, you may use the following substitutions:   1 cup whole-wheat flour minus 2 tablespoons.   1/2 cup white flour plus 1/2 cup whole-wheat flour.    Low-Fat Diet BREADS, CEREALS, PASTA, RICE, DRIED PEAS, AND BEANS These products are high in carbohydrates and most are low in fat. Therefore, they can be increased in the diet as substitutes for fatty foods. They too, however, contain calories and should not be eaten in excess. Cereals can be eaten for snacks as well as for breakfast.  Include foods that contain fiber (fruits, vegetables, whole grains, and legumes). Research shows that fiber may lower blood cholesterol levels, especially the water-soluble fiber found in fruits, vegetables, oat products, and legumes. FRUITS AND VEGETABLES It is good to eat fruits and vegetables. Besides being sources of fiber, both are rich in vitamins and some minerals. They help you get the daily allowances of these nutrients. Fruits and vegetables can be used for snacks and desserts. MEATS Limit lean meat, chicken, [place], and fish to no more than 6 ounces per day. Beef, Pork, and Lamb Use lean cuts of beef, pork, and lamb. Lean cuts include:  Extra-lean ground beef.  Arm roast.  Sirloin tip.  Center-cut ham.  Round steak.  Loin chops.  Rump roast.  Tenderloin.  Trim all fat off the outside of meats before cooking. It is not necessary to severely decrease the intake of red meat, but lean choices should be made. Lean meat is rich in protein and contains a highly absorbable form of iron. Premenopausal women, in particular, should  avoid reducing lean red meat because this could increase the risk for low red blood cells (iron-deficiency anemia).  Chicken and [place] These are good sources of protein. The fat of poultry can be reduced by removing the skin and underlying fat layers before cooking. Chicken and [place] can be substituted for lean red meat in the diet. Poultry should not be fried or covered with high-fat sauces. Fish and Shellfish Fish is a good source of protein. Shellfish contain cholesterol, but they usually are low in saturated fatty acids. The preparation of  fish is important. Like chicken and [place], they should not be fried or covered with high-fat sauces. EGGS Egg whites contain no fat or cholesterol. They can be eaten often. Try 1 to 2 egg whites instead of whole eggs in recipes or use egg substitutes that do not contain yolk.  MILK AND DAIRY PRODUCTS Use skim or 1% milk instead of 2% or whole milk. Decrease whole milk, natural, and processed cheeses. Use nonfat or low-fat (2%) cottage cheese or low-fat cheeses made from vegetable oils. Choose nonfat or low-fat (1 to 2%) yogurt. Experiment with evaporated skim milk in recipes that call for heavy cream. Substitute low-fat yogurt or low-fat cottage cheese for sour cream in dips and salad dressings. Have at least 2 servings of low-fat dairy products, such as 2 glasses of skim (or 1%) milk each day to help get your daily calcium intake.  FATS AND OILS Butterfat, lard, and beef fats are high in saturated fat and cholesterol. These should be avoided.Vegetable fats do not contain cholesterol. AVOID coconut oil, palm oil, and palm kernel oil, WHICH are very high in saturated fats. These should be limited. These fats are often used in bakery goods, processed foods, popcorn, oils, and nondairy creamers. Vegetable shortenings and some peanut butters contain hydrogenated oils, which are also saturated fats. Read the labels on these foods and check for saturated vegetable  oils.  Desirable liquid vegetable oils are corn oil, cottonseed oil, olive oil, canola oil, safflower oil, soybean oil, and sunflower oil. Peanut oil is not as good, but small amounts are acceptable. Buy a heart-healthy tub margarine that has no partially hydrogenated oils in the ingredients. AVOID Mayonnaise and salad dressings often are made from unsaturated fats.  OTHER EATING TIPS Snacks  Most sweets should be limited as snacks. They tend to be rich in calories and fats, and their caloric content outweighs their nutritional value. Some good choices in snacks are graham crackers, melba toast, soda crackers, bagels (no egg), English muffins, fruits, and vegetables. These snacks are preferable to snack crackers, [place] fries, and chips. Popcorn should be air-popped or cooked in small amounts of liquid vegetable oil.  Desserts Eat fruit, low-fat yogurt, and fruit ices instead of pastries, cake, and cookies. Sherbet, angel food cake, gelatin dessert, frozen low-fat yogurt, or other frozen products that do not contain saturated fat (pure fruit juice bars, frozen ice pops) are also acceptable.   COOKING METHODS Choose those methods that use little or no fat. They include: Poaching.  Braising.  Steaming.  Grilling.  Baking.  Stir-frying.  Broiling.  Microwaving.  Foods can be cooked in a nonstick pan without added fat, or use a nonfat cooking spray in regular cookware. Limit fried foods and avoid frying in saturated fat. Add moisture to lean meats by using water, broth, cooking wines, and other nonfat or low-fat sauces along with the cooking methods mentioned above. Soups and stews should be chilled after cooking. The fat that forms on top after a few hours in the refrigerator should be skimmed off. When preparing meals, avoid using excess salt. Salt can contribute to raising blood pressure in some people.  EATING AWAY FROM HOME Order entres, potatoes, and vegetables without sauces or butter.  When meat exceeds the size of a deck of cards (3 to 4 ounces), the rest can be taken home for another meal. Choose vegetable or fruit salads and ask for low-calorie salad dressings to be served on the side. Use dressings sparingly. Limit high-fat toppings, such  as bacon, crumbled eggs, cheese, sunflower seeds, and olives. Ask for heart-healthy tub margarine instead of butter. Diverticulosis Diverticulosis is a common condition that develops when small pouches (diverticula) form in the wall of the colon. The risk of diverticulosis increases with age. It happens more often in people who eat a low-fiber diet. Most individuals with diverticulosis have no symptoms. Those individuals with symptoms usually experience belly (abdominal) pain, constipation, or loose stools (diarrhea).  HOME CARE INSTRUCTIONS  Increase the amount of fiber in your diet as directed by your caregiver or dietician. This may reduce symptoms of diverticulosis.   Drink at least 6 to 8 glasses of water each day to prevent constipation.   Try not to strain when you have a bowel movement.   Avoiding nuts and seeds to prevent complications is still an uncertain benefit.   FOODS HAVING HIGH FIBER CONTENT INCLUDE:  Fruits. Apple, peach, pear, tangerine, raisins, prunes.   Vegetables. Brussels sprouts, asparagus, broccoli, cabbage, carrot, cauliflower, romaine lettuce, spinach, summer squash, tomato, winter squash, zucchini.   Starchy Vegetables. Baked beans, kidney beans, lima beans, split peas, lentils, potatoes (with skin).   Grains. Whole wheat bread, brown rice, bran flake cereal, plain oatmeal, white rice, shredded wheat, bran muffins.   SEEK IMMEDIATE MEDICAL CARE IF:  You develop increasing pain or severe bloating.   You have an oral temperature above 101F.   You develop vomiting or bowel movements that are bloody or black.   Hemorrhoids Hemorrhoids are dilated (enlarged) veins around the rectum. Sometimes clots  will form in the veins. This makes them swollen and painful. These are called thrombosed hemorrhoids. Causes of hemorrhoids include:  Constipation.   Straining to have a bowel movement.   HEAVY LIFTING HOME CARE INSTRUCTIONS  Eat a well balanced diet and drink 6 to 8 glasses of water every day to avoid constipation. You may also use a bulk laxative.   Avoid straining to have bowel movements.   Keep anal area dry and clean.   Do not use a donut shaped pillow or sit on the toilet for long periods. This increases blood pooling and pain.   Move your bowels when your body has the urge; this will require less straining and will decrease pain and pressure.

## 2011-09-23 NOTE — Anesthesia Preprocedure Evaluation (Addendum)
Anesthesia Evaluation  Patient identified by MRN, date of birth, ID band Patient awake    Reviewed: Allergy & Precautions, H&P , NPO status , Patient's Chart, lab work & pertinent test results  History of Anesthesia Complications Negative for: history of anesthetic complications  Airway Mallampati: III  Neck ROM: Full  Mouth opening: Limited Mouth Opening  Dental  (+) Missing   Pulmonary Current Smoker,  breath sounds clear to auscultation        Cardiovascular Rhythm:Regular     Neuro/Psych  Headaches, Anxiety    GI/Hepatic GERD-  Medicated,  Endo/Other    Renal/GU      Musculoskeletal   Abdominal   Peds  Hematology   Anesthesia Other Findings   Reproductive/Obstetrics                           Anesthesia Physical Anesthesia Plan  ASA: II  Anesthesia Plan: MAC   Post-op Pain Management:    Induction: Intravenous  Airway Management Planned: Simple Face Mask  Additional Equipment:   Intra-op Plan:   Post-operative Plan:   Informed Consent: I have reviewed the patients History and Physical, chart, labs and discussed the procedure including the risks, benefits and alternatives for the proposed anesthesia with the patient or authorized representative who has indicated his/her understanding and acceptance.     Plan Discussed with:   Anesthesia Plan Comments:         Anesthesia Quick Evaluation  

## 2011-09-23 NOTE — Anesthesia Postprocedure Evaluation (Signed)
  Anesthesia Post-op Note  Patient: Danielle Vazquez  Procedure(s) Performed: Procedure(s) (LRB): COLONOSCOPY WITH PROPOFOL (N/A) ESOPHAGOGASTRODUODENOSCOPY (EGD) WITH ESOPHAGEAL DILATION ()  Patient Location: PACU  Anesthesia Type: MAC  Level of Consciousness: awake, alert , oriented and patient cooperative  Airway and Oxygen Therapy: Patient Spontanous Breathing  Post-op Pain: none  Post-op Assessment: Post-op Vital signs reviewed, Patient's Cardiovascular Status Stable, Respiratory Function Stable, Patent Airway and No signs of Nausea or vomiting  Post-op Vital Signs: Reviewed and stable  Complications: No apparent anesthesia complications

## 2011-09-23 NOTE — Interval H&P Note (Signed)
History and Physical Interval Note:  09/23/2011 7:43 AM  Danielle Vazquez  has presented today for surgery, with the diagnosis of hematochezia and refractory GERD  The various methods of treatment have been discussed with the patient and family. After consideration of risks, benefits and other options for treatment, the patient has consented to  Procedure(s) (LRB): COLONOSCOPY WITH PROPOFOL (N/A) ESOPHAGOGASTRODUODENOSCOPY (EGD) WITH PROPOFOL (N/A) as a surgical intervention .  The patients' history has been reviewed, patient examined, no change in status, stable for surgery.  I have reviewed the patients' chart and labs.  Questions were answered to the patient's satisfaction.     [employer]

## 2011-09-23 NOTE — Progress Notes (Signed)
REVIEWED.  

## 2011-09-23 NOTE — Transfer of Care (Signed)
Immediate Anesthesia Transfer of Care Note  Patient: Danielle Vazquez  Procedure(s) Performed: Procedure(s) (LRB): COLONOSCOPY WITH PROPOFOL (N/A) ESOPHAGOGASTRODUODENOSCOPY (EGD) WITH ESOPHAGEAL DILATION ()  Patient Location: PACU  Anesthesia Type: MAC  Level of Consciousness: awake and patient cooperative  Airway & Oxygen Therapy: Patient Spontanous Breathing and Patient connected to face mask oxygen  Post-op Assessment: Report given to PACU RN, Post -op Vital signs reviewed and stable and Patient moving all extremities  Post vital signs: Reviewed and stable  Complications: No apparent anesthesia complications

## 2011-09-24 ENCOUNTER — Telehealth: Payer: Self-pay | Admitting: Gastroenterology

## 2011-09-30 NOTE — Telephone Encounter (Addendum)
Please call pt. She had simple adenomas removed from her colon. HER H PYLORI IS GONE. CONTINUE PROTONIX. High fiber low fat diet. OPV IN 4 mos. TCS in 5 years. High fiber diet.

## 2011-10-01 NOTE — Telephone Encounter (Signed)
LM for pt to call

## 2011-10-01 NOTE — Telephone Encounter (Signed)
Results Cc to PCP  

## 2011-10-02 NOTE — Telephone Encounter (Signed)
Pt was informed.

## 2011-10-03 NOTE — Telephone Encounter (Signed)
Reminder in epic to follow up in 4 months and tcs in 5 years

## 2011-11-19 ENCOUNTER — Other Ambulatory Visit: Payer: Self-pay | Admitting: Obstetrics & Gynecology

## 2011-11-19 DIAGNOSIS — R102 Pelvic and perineal pain: Secondary | ICD-10-CM

## 2011-11-19 DIAGNOSIS — G8929 Other chronic pain: Secondary | ICD-10-CM

## 2011-11-24 ENCOUNTER — Ambulatory Visit (HOSPITAL_COMMUNITY)
Admission: RE | Admit: 2011-11-24 | Discharge: 2011-11-24 | Disposition: A | Discharge status: Home / Self Care | Payer: Self-pay | Source: Ambulatory Visit | Attending: Obstetrics & Gynecology | Admitting: Obstetrics & Gynecology

## 2011-11-24 ENCOUNTER — Other Ambulatory Visit (HOSPITAL_COMMUNITY): Payer: Self-pay | Admitting: Obstetrics & Gynecology

## 2011-11-24 DIAGNOSIS — G8929 Other chronic pain: Secondary | ICD-10-CM

## 2011-11-24 DIAGNOSIS — N83209 Unspecified ovarian cyst, unspecified side: Secondary | ICD-10-CM | POA: Insufficient documentation

## 2011-11-24 DIAGNOSIS — N949 Unspecified condition associated with female genital organs and menstrual cycle: Secondary | ICD-10-CM | POA: Insufficient documentation

## 2011-11-24 DIAGNOSIS — R102 Pelvic and perineal pain: Secondary | ICD-10-CM

## 2011-11-24 DIAGNOSIS — N920 Excessive and frequent menstruation with regular cycle: Secondary | ICD-10-CM | POA: Insufficient documentation

## 2011-12-01 ENCOUNTER — Encounter: Payer: Self-pay | Admitting: Gastroenterology

## 2012-03-31 ENCOUNTER — Telehealth (HOSPITAL_COMMUNITY): Payer: Self-pay

## 2012-05-17 ENCOUNTER — Other Ambulatory Visit (HOSPITAL_COMMUNITY): Payer: Self-pay | Admitting: Internal Medicine

## 2012-05-17 DIAGNOSIS — Z139 Encounter for screening, unspecified: Secondary | ICD-10-CM

## 2012-05-24 ENCOUNTER — Ambulatory Visit (HOSPITAL_COMMUNITY): Payer: Self-pay

## 2012-05-25 ENCOUNTER — Encounter (HOSPITAL_COMMUNITY): Payer: Self-pay | Admitting: *Deleted

## 2012-05-25 ENCOUNTER — Emergency Department (HOSPITAL_COMMUNITY)
Admission: EM | Admit: 2012-05-25 | Discharge: 2012-05-25 | Disposition: A | Discharge status: Home / Self Care | Payer: Self-pay | Attending: Emergency Medicine | Admitting: Emergency Medicine

## 2012-05-25 DIAGNOSIS — G894 Chronic pain syndrome: Secondary | ICD-10-CM | POA: Insufficient documentation

## 2012-05-25 DIAGNOSIS — F411 Generalized anxiety disorder: Secondary | ICD-10-CM | POA: Insufficient documentation

## 2012-05-25 DIAGNOSIS — H6690 Otitis media, unspecified, unspecified ear: Secondary | ICD-10-CM | POA: Insufficient documentation

## 2012-05-25 DIAGNOSIS — Z79899 Other long term (current) drug therapy: Secondary | ICD-10-CM | POA: Insufficient documentation

## 2012-05-25 DIAGNOSIS — H6692 Otitis media, unspecified, left ear: Secondary | ICD-10-CM

## 2012-05-25 DIAGNOSIS — F17200 Nicotine dependence, unspecified, uncomplicated: Secondary | ICD-10-CM | POA: Insufficient documentation

## 2012-05-25 DIAGNOSIS — Z8719 Personal history of other diseases of the digestive system: Secondary | ICD-10-CM | POA: Insufficient documentation

## 2012-05-25 MED ORDER — ANTIPYRINE-BENZOCAINE 5.4-1.4 % OT SOLN
3.0000 [drp] | Freq: Once | OTIC | Status: AC
  Administered 2012-05-25: 4 [drp] via OTIC
  Filled 2012-05-25: qty 10

## 2012-05-25 MED ORDER — CEPHALEXIN 500 MG PO CAPS: 500.0000 mg | ORAL_CAPSULE | Freq: Four times a day (QID) | ORAL | Status: DC

## 2012-05-25 NOTE — ED Notes (Signed)
Left ear pain since Wednesday. Family stated there appears to be a metal object in ear.

## 2012-05-26 NOTE — ED Provider Notes (Signed)
History     CSN: [id number]  Arrival date & time 05/25/12  4098   First MD Initiated Contact with Patient 05/25/12 1023      Chief Complaint  Patient presents with  . Otalgia    (Consider location/radiation/quality/duration/timing/severity/associated sxs/prior treatment) HPI Comments: Patient c/o pain to her left ear for 5-6 days.  States that a family member looked in her ear and thought she may have a foreign body in her ear.  States she has been using Q-tips in her ear as well.  She denies headache, dizziness, sore throat, fever, or hearing loss.    Patient is a 41 y.o. female presenting with ear pain. The history is provided by the patient.  Otalgia This is a new problem. The current episode started more than 2 days ago. There is pain in the left ear. The problem occurs constantly. The problem has been gradually worsening. There has been no fever. The pain is moderate. Pertinent negatives include no ear discharge, no headaches, no hearing loss, no rhinorrhea, no sore throat, no vomiting, no neck pain, no cough and no rash.    Past Medical History  Diagnosis Date  . Anxiety   . GERD (gastroesophageal reflux disease)   . Chronic pain syndrome   . DDD (degenerative disc disease)     has had injections in past  . Degenerative disc disease     Past Surgical History  Procedure Date  . Cholecystectomy 2009  . Tubal ligation 1993    Family History  Problem Relation Age of Onset  . Heart attack Mother   . Heart attack Father   . Colon cancer Neg Hx   . Anesthesia problems Neg Hx   . Hypotension Neg Hx   . Malignant hyperthermia Neg Hx   . Pseudochol deficiency Neg Hx     History  Substance Use Topics  . Smoking status: Current Every Day Smoker -- 0.5 packs/day for 25 years    Types: Cigarettes  . Smokeless tobacco: Not on file  . Alcohol Use: No     Comment: rarely    OB History    Grav Para Term Preterm Abortions TAB SAB Ect Mult Living                   Review of Systems  Constitutional: Negative for fever.  HENT: Positive for ear pain. Negative for hearing loss, congestion, sore throat, facial swelling, rhinorrhea, neck pain, tinnitus and ear discharge.   Respiratory: Negative for cough.   Gastrointestinal: Negative for vomiting.  Skin: Negative for rash.  Neurological: Negative for dizziness, facial asymmetry and headaches.    Allergies  Codeine  Home Medications   Current Outpatient Rx  Name  Route  Sig  Dispense  Refill  . CARBAMAZEPINE 100 MG PO CHEW   Oral   Chew 100 mg by mouth 2 (two) times daily. headaches         . DIAZEPAM 10 MG PO TABS   Oral   Take 10 mg by mouth 4 (four) times daily.         . IBUPROFEN 200 MG PO TABS   Oral   Take 400 mg by mouth every 8 (eight) hours as needed. Fever/aches and pain         . MEGESTROL ACETATE 40 MG PO TABS   Oral   Take 40 mg by mouth daily.         . OXYCODONE HCL 30 MG PO TABS   Oral  Take 30 mg by mouth every 3 (three) hours as needed. pain         . PSEUDOEPHEDRINE HCL 60 MG PO TABS   Oral   Take 60 mg by mouth every 6 (six) hours as needed. Cold symptoms         . CEPHALEXIN 500 MG PO CAPS   Oral   Take 1 capsule (500 mg total) by mouth 4 (four) times daily.   28 capsule   0     BP 135/81  Pulse 89  Temp 97.7 F (36.5 C) (Oral)  Ht 5\' 6"  (1.676 m)  Wt 140 lb (63.504 kg)  BMI 22.60 kg/m2  SpO2 99%  Physical Exam  Nursing note and vitals reviewed. Constitutional: She is oriented to person, place, and time. She appears well-developed and well-nourished. No distress.  HENT:  Head: Normocephalic and atraumatic.  Right Ear: Tympanic membrane and ear canal normal.  Left Ear: Tympanic membrane normal.  Mouth/Throat: Uvula is midline, oropharynx is clear and moist and mucous membranes are normal.       Small abrasion to the lower left ear canal.  Pin point pustule also present.  No edema or drainage of the canal.  TM appears nml.  No FB  seen  Eyes: Conjunctivae normal and EOM are normal. Pupils are equal, round, and reactive to light.  Cardiovascular: Normal rate, regular rhythm, normal heart sounds and intact distal pulses.   No murmur heard. Pulmonary/Chest: Effort normal.  Musculoskeletal: Normal range of motion.  Neurological: She is alert and oriented to person, place, and time. She exhibits normal muscle tone. Coordination normal.  Skin: Skin is warm and dry.    ED Course  Procedures (including critical care time)  Labs Reviewed - No data to display No results found.   1. Otitis media of left ear       MDM     Auralgan otic drops dispensed in ED.  Prescribed keflex. Pt to f/u with PMD     Lilyth Lawyer L. [place], [place] 05/26/12 2114

## 2012-05-28 ENCOUNTER — Ambulatory Visit (HOSPITAL_COMMUNITY): Payer: Self-pay

## 2012-05-28 NOTE — ED Provider Notes (Signed)
Medical screening examination/treatment/procedure(s) were performed by non-physician practitioner and as supervising physician I was immediately available for consultation/collaboration. Yancy Hascall, MD, FACEP   Deklyn Trachtenberg L Weslee Prestage, MD 05/28/12 2117 

## 2012-06-01 ENCOUNTER — Ambulatory Visit (HOSPITAL_COMMUNITY)
Admission: RE | Admit: 2012-06-01 | Discharge: 2012-06-01 | Disposition: A | Discharge status: Home / Self Care | Payer: Self-pay | Source: Ambulatory Visit | Attending: Internal Medicine | Admitting: Internal Medicine

## 2012-06-01 DIAGNOSIS — Z139 Encounter for screening, unspecified: Secondary | ICD-10-CM

## 2012-06-01 DIAGNOSIS — Z1231 Encounter for screening mammogram for malignant neoplasm of breast: Secondary | ICD-10-CM | POA: Insufficient documentation

## 2012-07-13 ENCOUNTER — Other Ambulatory Visit (HOSPITAL_COMMUNITY): Payer: Self-pay | Admitting: Internal Medicine

## 2012-07-13 DIAGNOSIS — M5410 Radiculopathy, site unspecified: Secondary | ICD-10-CM

## 2012-07-13 DIAGNOSIS — M545 Low back pain, unspecified: Secondary | ICD-10-CM

## 2012-07-15 ENCOUNTER — Ambulatory Visit (HOSPITAL_COMMUNITY)
Admission: RE | Admit: 2012-07-15 | Discharge: 2012-07-15 | Disposition: A | Discharge status: Home / Self Care | Payer: Self-pay | Source: Ambulatory Visit | Attending: Internal Medicine | Admitting: Internal Medicine

## 2012-07-15 DIAGNOSIS — M545 Low back pain, unspecified: Secondary | ICD-10-CM

## 2012-07-15 DIAGNOSIS — M5410 Radiculopathy, site unspecified: Secondary | ICD-10-CM

## 2012-07-15 DIAGNOSIS — M79609 Pain in unspecified limb: Secondary | ICD-10-CM | POA: Insufficient documentation

## 2012-07-15 DIAGNOSIS — M5126 Other intervertebral disc displacement, lumbar region: Secondary | ICD-10-CM | POA: Insufficient documentation

## 2012-10-06 ENCOUNTER — Other Ambulatory Visit (HOSPITAL_COMMUNITY): Payer: Self-pay | Admitting: Internal Medicine

## 2012-10-06 DIAGNOSIS — R1310 Dysphagia, unspecified: Secondary | ICD-10-CM

## 2012-10-07 ENCOUNTER — Ambulatory Visit (HOSPITAL_COMMUNITY)
Admission: RE | Admit: 2012-10-07 | Discharge: 2012-10-07 | Disposition: A | Discharge status: Home / Self Care | Payer: Self-pay | Source: Ambulatory Visit | Attending: Internal Medicine | Admitting: Internal Medicine

## 2012-10-07 DIAGNOSIS — R1310 Dysphagia, unspecified: Secondary | ICD-10-CM | POA: Insufficient documentation

## 2012-10-07 DIAGNOSIS — K222 Esophageal obstruction: Secondary | ICD-10-CM | POA: Insufficient documentation

## 2012-11-25 ENCOUNTER — Ambulatory Visit (INDEPENDENT_AMBULATORY_CARE_PROVIDER_SITE_OTHER): Payer: Self-pay | Admitting: Otolaryngology

## 2012-11-25 DIAGNOSIS — H9209 Otalgia, unspecified ear: Secondary | ICD-10-CM

## 2012-12-09 ENCOUNTER — Other Ambulatory Visit: Payer: Self-pay | Admitting: Obstetrics & Gynecology

## 2012-12-28 ENCOUNTER — Other Ambulatory Visit: Payer: Self-pay | Admitting: Obstetrics & Gynecology

## 2012-12-30 ENCOUNTER — Emergency Department (HOSPITAL_COMMUNITY): Payer: Self-pay

## 2012-12-30 ENCOUNTER — Encounter (HOSPITAL_COMMUNITY): Payer: Self-pay | Admitting: Emergency Medicine

## 2012-12-30 ENCOUNTER — Emergency Department (HOSPITAL_COMMUNITY)
Admission: EM | Admit: 2012-12-30 | Discharge: 2012-12-31 | Disposition: A | Discharge status: Home / Self Care | Payer: Self-pay | Attending: Emergency Medicine | Admitting: Emergency Medicine

## 2012-12-30 DIAGNOSIS — Z8719 Personal history of other diseases of the digestive system: Secondary | ICD-10-CM | POA: Insufficient documentation

## 2012-12-30 DIAGNOSIS — Z79899 Other long term (current) drug therapy: Secondary | ICD-10-CM | POA: Insufficient documentation

## 2012-12-30 DIAGNOSIS — K529 Noninfective gastroenteritis and colitis, unspecified: Secondary | ICD-10-CM

## 2012-12-30 DIAGNOSIS — Z9889 Other specified postprocedural states: Secondary | ICD-10-CM | POA: Insufficient documentation

## 2012-12-30 DIAGNOSIS — K5289 Other specified noninfective gastroenteritis and colitis: Secondary | ICD-10-CM | POA: Insufficient documentation

## 2012-12-30 DIAGNOSIS — R197 Diarrhea, unspecified: Secondary | ICD-10-CM | POA: Insufficient documentation

## 2012-12-30 DIAGNOSIS — F17200 Nicotine dependence, unspecified, uncomplicated: Secondary | ICD-10-CM | POA: Insufficient documentation

## 2012-12-30 DIAGNOSIS — Z3202 Encounter for pregnancy test, result negative: Secondary | ICD-10-CM | POA: Insufficient documentation

## 2012-12-30 DIAGNOSIS — F411 Generalized anxiety disorder: Secondary | ICD-10-CM | POA: Insufficient documentation

## 2012-12-30 DIAGNOSIS — R112 Nausea with vomiting, unspecified: Secondary | ICD-10-CM | POA: Insufficient documentation

## 2012-12-30 DIAGNOSIS — Z9851 Tubal ligation status: Secondary | ICD-10-CM | POA: Insufficient documentation

## 2012-12-30 DIAGNOSIS — Z8739 Personal history of other diseases of the musculoskeletal system and connective tissue: Secondary | ICD-10-CM | POA: Insufficient documentation

## 2012-12-30 HISTORY — DX: Diaphragmatic hernia without obstruction or gangrene: K44.9

## 2012-12-30 HISTORY — DX: Gastritis, unspecified, without bleeding: K29.70

## 2012-12-30 HISTORY — DX: Diverticulosis of intestine, part unspecified, without perforation or abscess without bleeding: K57.90

## 2012-12-30 LAB — CBC WITH DIFFERENTIAL/PLATELET
Basophils Absolute: 0 10*3/uL (ref 0.0–0.1)
Basophils Relative: 0 % (ref 0–1)
HCT: 45.5 % (ref 36.0–46.0)
Hemoglobin: 15.5 g/dL — ABNORMAL HIGH (ref 12.0–15.0)
Lymphocytes Relative: 11 % — ABNORMAL LOW (ref 12–46)
Monocytes Relative: 6 % (ref 3–12)
Neutro Abs: 10.5 10*3/uL — ABNORMAL HIGH (ref 1.7–7.7)
Smear Review: ADEQUATE
WBC: 13 10*3/uL — ABNORMAL HIGH (ref 4.0–10.5)

## 2012-12-30 LAB — COMPREHENSIVE METABOLIC PANEL
ALT: 23 U/L (ref 0–35)
AST: 15 U/L (ref 0–37)
Alkaline Phosphatase: 92 U/L (ref 39–117)
CO2: 23 mEq/L (ref 19–32)
GFR calc Af Amer: 79 mL/min — ABNORMAL LOW (ref >90)
Glucose, Bld: 113 mg/dL — ABNORMAL HIGH (ref 70–99)
Potassium: 2.8 mEq/L — ABNORMAL LOW (ref 3.5–5.1)
Sodium: 136 mEq/L (ref 135–145)
Total Protein: 7.2 g/dL (ref 6.0–8.3)

## 2012-12-30 LAB — URINALYSIS, ROUTINE W REFLEX MICROSCOPIC
Glucose, UA: NEGATIVE mg/dL
Specific Gravity, Urine: >1.03 — ABNORMAL HIGH (ref 1.005–1.030)
pH: 5.5 (ref 5.0–8.0)

## 2012-12-30 LAB — URINE MICROSCOPIC-ADD ON

## 2012-12-30 LAB — PREGNANCY, URINE: Preg Test, Ur: NEGATIVE

## 2012-12-30 MED ORDER — SODIUM CHLORIDE 0.9 % IV SOLN
INTRAVENOUS | Status: DC
  Administered 2012-12-30: 21:00:00 via INTRAVENOUS

## 2012-12-30 MED ORDER — METRONIDAZOLE 500 MG PO TABS
500.0000 mg | ORAL_TABLET | Freq: Once | ORAL | Status: AC
  Administered 2012-12-30: 500 mg via ORAL
  Filled 2012-12-30: qty 1

## 2012-12-30 MED ORDER — MORPHINE SULFATE 4 MG/ML IJ SOLN
4.0000 mg | INTRAMUSCULAR | Status: AC | PRN
  Administered 2012-12-30 (×2): 4 mg via INTRAVENOUS
  Filled 2012-12-30 (×2): qty 1

## 2012-12-30 MED ORDER — POTASSIUM CHLORIDE 20 MEQ/15ML (10%) PO LIQD
40.0000 meq | Freq: Once | ORAL | Status: AC
  Administered 2012-12-30: 40 meq via ORAL
  Filled 2012-12-30: qty 30

## 2012-12-30 MED ORDER — IOHEXOL 300 MG/ML  SOLN
100.0000 mL | Freq: Once | INTRAMUSCULAR | Status: AC | PRN
  Administered 2012-12-30: 100 mL via INTRAVENOUS

## 2012-12-30 MED ORDER — CIPROFLOXACIN HCL 250 MG PO TABS
500.0000 mg | ORAL_TABLET | Freq: Once | ORAL | Status: AC
  Administered 2012-12-30: 500 mg via ORAL
  Filled 2012-12-30: qty 2

## 2012-12-30 MED ORDER — ONDANSETRON HCL 4 MG/2ML IJ SOLN
4.0000 mg | INTRAMUSCULAR | Status: AC | PRN
  Administered 2012-12-30 (×2): 4 mg via INTRAVENOUS
  Filled 2012-12-30 (×2): qty 2

## 2012-12-30 MED ORDER — IOHEXOL 300 MG/ML  SOLN
50.0000 mL | Freq: Once | INTRAMUSCULAR | Status: AC | PRN
  Administered 2012-12-30: 50 mL via ORAL

## 2012-12-30 NOTE — ED Notes (Signed)
Patient c/o abdominal pain and vomiting since yesterday.  Patient states had diarrhea that started on Monday.

## 2012-12-30 NOTE — ED Notes (Signed)
Pt c/o increased pain and nausea while drinking CT prep.

## 2012-12-30 NOTE — ED Notes (Addendum)
Pt continues to c/o abdominal cramping and diarrhea.  Reinforced with pt what the physicians recommendations were regarding medication for diarrhea.  Pt is tolerating p.o fluids at this time. C/o mild nausea, but no vomiting at present.

## 2012-12-30 NOTE — ED Notes (Signed)
Pt reporting pain in lower quadrants bilaterally in abdomen.  Reports pain is intermittent sharp cramping in nature.  Reports nausea and vomiting as well.  Denies problems with diarrhea or constipation.  Denies problems with urination as well.  Pt stating pain began yesterday.

## 2012-12-31 MED ORDER — CIPROFLOXACIN HCL 500 MG PO TABS: 500.0000 mg | ORAL_TABLET | Freq: Two times a day (BID) | ORAL | Status: DC

## 2012-12-31 MED ORDER — ONDANSETRON HCL 4 MG/2ML IJ SOLN
INTRAMUSCULAR | Status: AC
  Administered 2012-12-31: 4 mg
  Filled 2012-12-31: qty 2

## 2012-12-31 MED ORDER — METRONIDAZOLE 500 MG PO TABS: 500.0000 mg | ORAL_TABLET | Freq: Three times a day (TID) | ORAL | Status: DC

## 2012-12-31 MED ORDER — ONDANSETRON HCL 4 MG PO TABS: 4.0000 mg | ORAL_TABLET | Freq: Three times a day (TID) | ORAL | Status: DC | PRN

## 2012-12-31 MED ORDER — DICYCLOMINE HCL 20 MG PO TABS: 20.0000 mg | ORAL_TABLET | Freq: Four times a day (QID) | ORAL | Status: DC | PRN

## 2012-12-31 NOTE — ED Provider Notes (Signed)
History    CSN: [id number] Arrival date & time 12/30/12  1939  First MD Initiated Contact with Patient 12/30/12 2019     Chief Complaint  Patient presents with  . Abdominal Pain  . Emesis    HPI Pt was seen at 2055. Per pt, c/o gradual onset and persistence of multiple intermittent episodes of N/V/D that began yesterday.   Describes the stools as "watery." Has been associated with generalized "cramping" abd pain. Denies CP/SOB, no back pain, no fevers, no black or blood in stools or emesis. Denies recent travel, no sick contacts with same, no recent abx use.      GI: Dr. Darrick Penna Past Medical History  Diagnosis Date  . Anxiety   . GERD (gastroesophageal reflux disease)   . Chronic pain syndrome   . DDD (degenerative disc disease)     has had injections in past  . Degenerative disc disease   . Gastritis   . Diverticulosis   . Hiatal hernia    Past Surgical History  Procedure Laterality Date  . Cholecystectomy  2009  . Tubal ligation  1993   Family History  Problem Relation Age of Onset  . Heart attack Mother   . Heart attack Father   . Colon cancer Neg Hx   . Anesthesia problems Neg Hx   . Hypotension Neg Hx   . Malignant hyperthermia Neg Hx   . Pseudochol deficiency Neg Hx    History  Substance Use Topics  . Smoking status: Current Every Day Smoker -- 0.50 packs/day for 25 years    Types: Cigarettes  . Smokeless tobacco: Not on file  . Alcohol Use: No     Comment: rarely    Review of Systems ROS: Statement: All systems negative except as marked or noted in the HPI; Constitutional: Negative for fever and chills. ; ; Eyes: Negative for eye pain, redness and discharge. ; ; ENMT: Negative for ear pain, hoarseness, nasal congestion, sinus pressure and sore throat. ; ; Cardiovascular: Negative for chest pain, palpitations, diaphoresis, dyspnea and peripheral edema. ; ; Respiratory: Negative for cough, wheezing and stridor. ; ; Gastrointestinal: +N/V/D, abd pain.  Negative for blood in stool, hematemesis, jaundice and rectal bleeding. . ; ; Genitourinary: Negative for dysuria, flank pain and hematuria. ; ; Musculoskeletal: Negative for back pain and neck pain. Negative for swelling and trauma.; ; Skin: Negative for pruritus, rash, abrasions, blisters, bruising and skin lesion.; ; Neuro: Negative for headache, lightheadedness and neck stiffness. Negative for weakness, altered level of consciousness , altered mental status, extremity weakness, paresthesias, involuntary movement, seizure and syncope.       Allergies  Codeine  Home Medications   Current Outpatient Rx  Name  Route  Sig  Dispense  Refill  . diazepam (VALIUM) 10 MG tablet   Oral   Take 10 mg by mouth 4 (four) times daily.         Marland Kitchen ibuprofen (ADVIL,MOTRIN) 200 MG tablet   Oral   Take 400 mg by mouth every 8 (eight) hours as needed. Fever/aches and pain         . megestrol (MEGACE) 40 MG tablet   Oral   Take 40 mg by mouth daily.         Marland Kitchen oxycodone (ROXICODONE) 30 MG immediate release tablet   Oral   Take 30 mg by mouth every 3 (three) hours as needed. pain          BP 101/68  Pulse 86  Temp(Src) 98.2 F (36.8 C) (Oral)  Resp 20  Ht 5\' 5"  (1.651 m)  Wt 135 lb (61.236 kg)  BMI 22.47 kg/m2  SpO2 96% Physical Exam 2100: Physical examination:  Nursing notes reviewed; Vital signs and O2 SAT reviewed;  Constitutional: Well developed, Well nourished, Well hydrated, In no acute distress; Head:  Normocephalic, atraumatic; Eyes: EOMI, PERRL, No scleral icterus; ENMT: Mouth and pharynx normal, Mucous membranes moist; Neck: Supple, Full range of motion, No lymphadenopathy; Cardiovascular: Regular rate and rhythm, No murmur, rub, or gallop; Respiratory: Breath sounds clear & equal bilaterally, No rales, rhonchi, wheezes.  Speaking full sentences with ease, Normal respiratory effort/excursion; Chest: Nontender, Movement normal; Abdomen: Soft, +mild diffuse tenderness to palp. No  rebound or guarding. Nondistended, Normal bowel sounds; Genitourinary: No CVA tenderness; Extremities: Pulses normal, No tenderness, No edema, No calf edema or asymmetry.; Neuro: AA&Ox3, Major CN grossly intact.  Speech clear. No gross focal motor or sensory deficits in extremities.; Skin: Color normal, Warm, Dry.   ED Course  Procedures  MDM  MDM Reviewed: previous chart, nursing note and vitals Reviewed previous: labs Interpretation: labs, x-ray and CT scan   Results for orders placed during the hospital encounter of 12/30/12  URINALYSIS, ROUTINE W REFLEX MICROSCOPIC      Result Value Range   Color, Urine ORANGE (*) YELLOW   APPearance CLOUDY (*) CLEAR   Specific Gravity, Urine >1.030 (*) 1.005 - 1.030   pH 5.5  5.0 - 8.0   Glucose, UA NEGATIVE  NEGATIVE mg/dL   Hgb urine dipstick MODERATE (*) NEGATIVE   Bilirubin Urine NEGATIVE  NEGATIVE   Ketones, ur 15 (*) NEGATIVE mg/dL   Protein, ur TRACE (*) NEGATIVE mg/dL   Urobilinogen, UA 0.2  0.0 - 1.0 mg/dL   Nitrite NEGATIVE  NEGATIVE   Leukocytes, UA NEGATIVE  NEGATIVE  PREGNANCY, URINE      Result Value Range   Preg Test, Ur NEGATIVE  NEGATIVE  CBC WITH DIFFERENTIAL      Result Value Range   WBC 13.0 (*) 4.0 - 10.5 K/uL   RBC 4.97  3.87 - 5.11 MIL/uL   Hemoglobin 15.5 (*) 12.0 - 15.0 g/dL   HCT 16.1  09.6 - 04.5 %   MCV 91.5  78.0 - 100.0 fL   MCH 31.2  26.0 - 34.0 pg   MCHC 34.1  30.0 - 36.0 g/dL   RDW 40.9  81.1 - 91.4 %   Platelets 320  150 - 400 K/uL   Neutrophils Relative % 81 (*) 43 - 77 %   Lymphocytes Relative 11 (*) 12 - 46 %   Monocytes Relative 6  3 - 12 %   Eosinophils Relative 2  0 - 5 %   Basophils Relative 0  0 - 1 %   Neutro Abs 10.5 (*) 1.7 - 7.7 K/uL   Lymphs Abs 1.4  0.7 - 4.0 K/uL   Monocytes Absolute 0.8  0.1 - 1.0 K/uL   Eosinophils Absolute 0.3  0.0 - 0.7 K/uL   Basophils Absolute 0.0  0.0 - 0.1 K/uL   WBC Morphology ATYPICAL LYMPHOCYTES     Smear Review       Value: PLATELET CLUMPS NOTED ON  SMEAR, COUNT APPEARS ADEQUATE  COMPREHENSIVE METABOLIC PANEL      Result Value Range   Sodium 136  135 - 145 mEq/L   Potassium 2.8 (*) 3.5 - 5.1 mEq/L   Chloride 101  96 - 112 mEq/L   CO2 23  19 - 32 mEq/L   Glucose, Bld 113 (*) 70 - 99 mg/dL   BUN 21  6 - 23 mg/dL   Creatinine, Ser 4.09  0.50 - 1.10 mg/dL   Calcium 9.1  8.4 - 81.1 mg/dL   Total Protein 7.2  6.0 - 8.3 g/dL   Albumin 4.1  3.5 - 5.2 g/dL   AST 15  0 - 37 U/L   ALT 23  0 - 35 U/L   Alkaline Phosphatase 92  39 - 117 U/L   Total Bilirubin 0.8  0.3 - 1.2 mg/dL   GFR calc non Af Amer 68 (*) >90 mL/min   GFR calc Af Amer 79 (*) >90 mL/min  LIPASE, BLOOD      Result Value Range   Lipase 24  11 - 59 U/L  HCG, SERUM, QUALITATIVE      Result Value Range   Preg, Serum NEGATIVE  NEGATIVE  URINE MICROSCOPIC-ADD ON      Result Value Range   Squamous Epithelial / LPF MANY (*) RARE   WBC, UA 3-6  <3 WBC/hpf   Bacteria, UA MANY (*) RARE   Dg Chest 2 View 12/30/2012   *RADIOLOGY REPORT*  Clinical Data: Abdominal pain, vomiting.  CHEST - 2 VIEW  Comparison: 10/05/2007  Findings: Heart and mediastinal contours are within normal limits. No focal opacities or effusions.  No acute bony abnormality.  IMPRESSION: No active cardiopulmonary disease.   Original Report Authenticated By: Charlett Nose, M.D.   Ct Abdomen Pelvis W Contrast 12/30/2012   *RADIOLOGY REPORT*  Clinical Data: Abdominal pain, vomiting, and diarrhea for 1 day.  CT ABDOMEN AND PELVIS WITH CONTRAST  Technique:  Multidetector CT imaging of the abdomen and pelvis was performed following the standard protocol during bolus administration of intravenous contrast.  Contrast: 50mL OMNIPAQUE IOHEXOL 300 MG/ML  SOLN, OMNIPAQUE IOHEXOL 300 MG/ML  SOLN  Comparison: 07/02/2011  Findings: Lung bases are clear.  Surgical absence of the gallbladder.  The liver, spleen, pancreas, adrenal glands, abdominal aorta, inferior vena cava, and retroperitoneal lymph nodes are unremarkable.  Focal  scarring and calyceal dilatation in the mid pole of the left kidney is stable since the previous study probably represents changes of previous reflux nephropathy or infection.  No solid mass or hydronephrosis in either kidney. The stomach and small bowel are not abnormally distended and no wall thickening is identified.  The colon is diffusely fluid-filled with multiple air-fluid levels consistent with liquid stool.  There is mild wall thickening suggested in the rectosigmoid colon.  Findings may represent changes of colitis or inflammatory bowel disease.  No free air or free fluid in the abdomen.  Minimal fat containing umbilical hernia.  Pelvis:  Small amount of fluid around the rectosigmoid junction may be inflammatory or physiologic.  Uterus and ovaries are not enlarged.  No loculated pelvic fluid collections.  Diverticulosis of the sigmoid colon.  No diverticulitis.  See above colonic findings.  No significant pelvic lymphadenopathy.  The appendix is normal.  Bladder wall is not thickened.  Normal alignment of the lumbar spine.  IMPRESSION: Mild thickening of the wall of the rectosigmoid colon with diffuse fluid and air fluid levels in the colon.  Changes consistent with diarrhea and may represent colitis or inflammatory bowel disease. Examination is otherwise stable since previous study.   Original Report Authenticated By: Burman Nieves, M.D.    0010:  UA contaminated. Pt offered admission. States she does not want to be admitted and she wants  to go home now. Has had 1 watery BM while in the ED. No vomiting. Has tol PO well. Potassium repleted PO. 1st dose abx given in the ED.  Dx and testing d/w pt.  Questions answered.  Verb understanding, agreeable to d/c home with outpt f/u with her GI MD.    Laray Anger, DO 01/01/13 2226

## 2013-01-01 LAB — URINE CULTURE

## 2013-01-10 ENCOUNTER — Telehealth: Payer: Self-pay | Admitting: Gastroenterology

## 2013-01-10 ENCOUNTER — Other Ambulatory Visit: Payer: Self-pay | Admitting: Obstetrics & Gynecology

## 2013-01-10 NOTE — Telephone Encounter (Signed)
Patient called returned your call and she said you could reach her on her cell [phone]).

## 2013-01-10 NOTE — Telephone Encounter (Signed)
I called pt. She said she is not aware when she has diarrhea, she is incontinent. She is on Flagyl and Cipro that was given at the ED. Zofran is helping with the nausea. Bentyl is not helping with the cramps. She is drinking lots of liquids. But it goes straight through. She would like earlier appt. Please advise!

## 2013-01-10 NOTE — Telephone Encounter (Signed)
Pt was seen recently in the ER and was calling to make OV. She is aware of OV on 8/8 at 1030 with LSL but she is asking if we have anything sooner to call her. She said she has been doubled over with abd pain and cramping. Vomiting and having diarrhea uncontrolled. I told her that I would have the nurse call her back to see if we could use urgent spot if available. Patient said she thinks she has crohn's disease but hasn't be diagnosed. [phone] or [phone]

## 2013-01-10 NOTE — Telephone Encounter (Signed)
Called and was told that she is asleep and they will have her call.

## 2013-01-10 NOTE — Telephone Encounter (Signed)
Last colonoscopy in April 2013 by Dr. Darrick Penna.  Needs Cdiff PCR, stool culture, Giardia Urgent visit. However, she was offered admission by the ED last week but declined. If her status worsens, needs to present to ED.

## 2013-01-11 ENCOUNTER — Other Ambulatory Visit: Payer: Self-pay

## 2013-01-11 ENCOUNTER — Encounter: Payer: Self-pay | Admitting: Gastroenterology

## 2013-01-11 DIAGNOSIS — R197 Diarrhea, unspecified: Secondary | ICD-10-CM

## 2013-01-11 NOTE — Telephone Encounter (Signed)
Pt returned call and was informed. She will pick up the stool orders/containers when she comes to the visit tomorrow. She said so far she has not had diarrhea today. She has OV with AS at 11:30 Am urgent slot per Tobi Bastos on 01/12/2013.

## 2013-01-11 NOTE — Telephone Encounter (Signed)
LMOM to call. Tobi Bastos said ok to use the urgent slot of 11:30 Am on 01/12/2013 ).

## 2013-01-12 ENCOUNTER — Ambulatory Visit (INDEPENDENT_AMBULATORY_CARE_PROVIDER_SITE_OTHER): Payer: Self-pay | Admitting: Gastroenterology

## 2013-01-12 ENCOUNTER — Telehealth: Payer: Self-pay

## 2013-01-12 ENCOUNTER — Encounter: Payer: Self-pay | Admitting: Gastroenterology

## 2013-01-12 VITALS — BP 104/73 | HR 84 | Temp 98.2°F | Ht 65.0 in | Wt 166.8 lb

## 2013-01-12 DIAGNOSIS — R195 Other fecal abnormalities: Secondary | ICD-10-CM

## 2013-01-12 DIAGNOSIS — R109 Unspecified abdominal pain: Secondary | ICD-10-CM

## 2013-01-12 LAB — CBC WITH DIFFERENTIAL/PLATELET
Eosinophils Absolute: 1.1 10*3/uL — ABNORMAL HIGH (ref 0.0–0.7)
Eosinophils Relative: 15 % — ABNORMAL HIGH (ref 0–5)
HCT: 40.5 % (ref 36.0–46.0)
Hemoglobin: 13.2 g/dL (ref 12.0–15.0)
Lymphocytes Relative: 38 % (ref 12–46)
Lymphs Abs: 2.6 10*3/uL (ref 0.7–4.0)
MCH: 30.8 pg (ref 26.0–34.0)
MCV: 94.6 fL (ref 78.0–100.0)
Monocytes Absolute: 0.4 10*3/uL (ref 0.1–1.0)
Monocytes Relative: 6 % (ref 3–12)
RBC: 4.28 MIL/uL (ref 3.87–5.11)
WBC: 6.9 10*3/uL (ref 4.0–10.5)

## 2013-01-12 LAB — BASIC METABOLIC PANEL
BUN: 10 mg/dL (ref 6–23)
Chloride: 106 mEq/L (ref 96–112)
Potassium: 4.2 mEq/L (ref 3.5–5.3)
Sodium: 140 mEq/L (ref 135–145)

## 2013-01-12 MED ORDER — PROMETHAZINE HCL 25 MG PO TABS: 25.0000 mg | ORAL_TABLET | Freq: Four times a day (QID) | ORAL | Status: DC | PRN

## 2013-01-12 MED ORDER — DICYCLOMINE HCL 10 MG PO CAPS: 10.0000 mg | ORAL_CAPSULE | Freq: Three times a day (TID) | ORAL | Status: DC

## 2013-01-12 NOTE — Progress Notes (Signed)
Referring Provider: Elfredia Nevins, MD Primary Care Physician:  Cassell Smiles., MD Primary GI: Dr. Darrick Penna   Chief Complaint  Patient presents with  . Emesis  . Abdominal Pain  . Diarrhea    not since Thursday    HPI:   Danielle Vazquez is a 42 year old female presenting today as an urgent work-in due to reports of N/V, abdominal pain, and diarrhea. Last colonoscopy and EGD in April 2013. EGD with esophageal stricture s/p dilation. Negative H.pylori. TCS with multiple tubular adenomas, internal hemorrhoids.  Seen in ED July 10 due to N/V/D. CT abd/pelvis showed mild thickening of rectosigmoid colon with diffuse fluid and air fluid levels in colon. Changes consistent with diarrhea (also noted may represent colitis or IBD). Mild leukocytosis with WBC 13 while in ED. Lipase normal. Potassium 2.8, UA with evidence of dehydration. Notes state she was offered admission; patient denies this and states she was not offered admission.   States symptoms started July 10th. Denies any sick contacts. Prescribed abx in ED. States she hasn't noticed improvement with the abx; these are completed.   Goes 3-4 days of diarrhea, multiple loose stools, then 3-4 days of just vomiting. No diarrhea since Monday. Started vomiting yesterday. Vomited twice yesterday. No vomiting this morning. Notes diffuse abdominal cramping, almost feeling like contractions. States abdomen will become distended and "hard as a brick". +flatus. Lucky if she pees twice a day. She has been able to tolerate liquids ok today, yesterday she was unable to. Notes fecal incontinence. +occasional hematochezia. Sometimes urine is dark. Does not appear to be on a PPI.   Nausea constant. Took last nausea pill yesterday. Went to beach from 16-20 and was able to eat ok then. BPE April 2014 with stricture at GE junction, obstructing the barium tablet. 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   Past Medical History  Diagnosis Date  . Anxiety    . GERD (gastroesophageal reflux disease)   . Chronic pain syndrome   . DDD (degenerative disc disease)     has had injections in past  . Degenerative disc disease   . Gastritis   . Diverticulosis   . Hiatal hernia     Past Surgical History  Procedure Laterality Date  . Cholecystectomy  2009  . Tubal ligation  1993  . Ileocolonoscopy  09/23/2011    ZOX:WRUEAV, multiple in the rectum/Polyps, multiple in the distal transverse colon/Diverticulosis,mild,left sided diverticulosis/Internal hemorrhoids/path: tubular adenomas  . Esophagogastroduodenoscopy  April 2013    SLF: esophageal stricture, gastritis, hiatal hernia, negative H.pylori    Current Outpatient Prescriptions  Medication Sig Dispense Refill  . diazepam (VALIUM) 10 MG tablet Take 10 mg by mouth 4 (four) times daily.      Marland Kitchen dicyclomine (BENTYL) 20 MG tablet Take 1 tablet (20 mg total) by mouth every 6 (six) hours as needed (abdominal cramping).  15 tablet  0  . ibuprofen (ADVIL,MOTRIN) 200 MG tablet Take 400 mg by mouth every 8 (eight) hours as needed. Fever/aches and pain      . megestrol (MEGACE) 40 MG tablet Take 40 mg by mouth daily.      . ondansetron (ZOFRAN) 4 MG tablet Take 1 tablet (4 mg total) by mouth every 8 (eight) hours as needed for nausea.  8 tablet  0  . oxycodone (ROXICODONE) 30 MG immediate release tablet Take 30 mg by mouth every 3 (three) hours as needed. pain       No current facility-administered medications for this visit.  Allergies as of 01/12/2013 - Review Complete 01/12/2013  Allergen Reaction Noted  . Codeine Nausea Only and Rash 09/09/2010    Family History  Problem Relation Age of Onset  . Heart attack Mother   . Heart attack Father   . Colon cancer Neg Hx   . Anesthesia problems Neg Hx   . Hypotension Neg Hx   . Malignant hyperthermia Neg Hx   . Pseudochol deficiency Neg Hx     History   Social History  . Marital Status: Single    Spouse Name: N/A    Number of Children: 3   . Years of Education: N/A   Occupational History  . unemployed    Social History Main Topics  . Smoking status: Current Every Day Smoker -- 0.50 packs/day for 25 years    Types: Cigarettes  . Smokeless tobacco: None  . Alcohol Use: No     Comment: rarely  . Drug Use: No  . Sexually Active: Yes    Birth Control/ Protection: Surgical, Other-see comments   Other Topics Concern  . None   Social History Narrative  . None    Review of Systems: Negative unless mentioned in HPI.   Physical Exam: BP 104/73  Pulse 84  Temp(Src) 98.2 F (36.8 C) (Oral)  Ht 5\' 5"  (1.651 m)  Wt 166 lb 12.8 oz (75.66 kg)  BMI 27.76 kg/m2 General:   Alert and oriented. Tearful. Does not appear acutely ill. Flat affect.  Head:  Normocephalic and atraumatic. Eyes:  Conjuctiva clear without scleral icterus. Mouth:  Oral mucosa pink and moist. Good dentition. No lesions. Neck:  Supple, without mass or thyromegaly. Heart:  S1, S2 present without murmurs, rubs, or gallops. Regular rate and rhythm. Abdomen:  +BS, soft, non-tender and non-distended. No rebound or guarding. No HSM or masses noted. Msk:  Symmetrical without gross deformities. Normal posture. Extremities:  Without edema. Neurologic:  Alert and  oriented x4;  grossly normal neurologically. Skin:  Intact without significant lesions or rashes. Cervical Nodes:  No significant cervical adenopathy. Psych:  Alert and cooperative. Normal mood and affect.

## 2013-01-12 NOTE — Patient Instructions (Addendum)
Please have stool studies completed.   Start taking Nexium each morning, 30 minutes before breakfast. I have also sent in a prescription for nausea called Phenergan. Take this every 8 hours as needed. Do not drive or operate machinery while using this.   Please have blood work completed today. We will call with the results. Go to the ED or call us if anything worsens.  In the meantime, I will be talking with Dr. Darrick Penna about possibly taking a look at your lower GI tract. In the future, we will need to do a repeat upper endoscopy.

## 2013-01-12 NOTE — Telephone Encounter (Signed)
Pt called and said she did not pick up the Bentyl. York Spaniel that is what she was given at the hospital and she took it every 6 hours and it did not help the cramps at all. She wants to know can you send in something else.

## 2013-01-13 ENCOUNTER — Ambulatory Visit (INDEPENDENT_AMBULATORY_CARE_PROVIDER_SITE_OTHER): Payer: Self-pay | Admitting: Otolaryngology

## 2013-01-13 DIAGNOSIS — R195 Other fecal abnormalities: Secondary | ICD-10-CM | POA: Insufficient documentation

## 2013-01-13 MED ORDER — HYOSCYAMINE SULFATE 0.125 MG SL SUBL: 0.1250 mg | SUBLINGUAL_TABLET | SUBLINGUAL | Status: DC | PRN

## 2013-01-13 NOTE — Telephone Encounter (Signed)
Pt is aware.  

## 2013-01-13 NOTE — Telephone Encounter (Signed)
I have sent in Levsin. This may be expensive.

## 2013-01-13 NOTE — Assessment & Plan Note (Signed)
Bouts of diarrhea, acute onset July 10th. CT at that time in the ED showed mild thickening of rectosigmoid colon, consistent with diarrhea but raises question of colitis or IBD. Associated abdominal cramping. No improvement with Cipro/Flagyl provided by ED. Occasional rectal bleeding. Upon presentation today, she has not had diarrhea for several days. No sick contacts. Will need to obtain stool studies to include Cdiff PCR, stool culture, and Giardia to rule out infectious process. Consider flex sig in near future if no improvement with symptomatic treatment. Add Bentyl. As of note, colonoscopy up-to-date, with multiple tubular adenomas as internal hemorrhoids.   Start Bentyl Stool studies Consider flex sig; to discuss with Dr. Darrick Penna, as patient needs an EGD in near future as well To ED If worsening of symptoms BMP, CBC now

## 2013-01-13 NOTE — Assessment & Plan Note (Signed)
Diffuse abdominal cramping, intermittent N/V, does not appear to be on a PPI. Last EGD in 2013 with esophageal stricture s/p dilation, +gastritis but negative H.pylori on path. Recent BPE April 2014 notes stricture at GE junction, potential tiny non-obstructing distal esophageal web. Interestingly, tolerated normal foods, foods of [place] at the beach last [place] without difficulty. Ultimately needs repeat EGD with dilation with Dr. Darrick Penna in near future; however, will discuss with her adding on flex sig at time of EGD to avoid multiple separate appointments.  Nexium samples provided. Follow soft diet for now Add Phenergan

## 2013-01-14 ENCOUNTER — Other Ambulatory Visit: Payer: Self-pay | Admitting: Gastroenterology

## 2013-01-14 MED ORDER — PEG-KCL-NACL-NASULF-NA ASC-C 100 G PO SOLR: 1.0000 | ORAL | Status: DC

## 2013-01-14 NOTE — Progress Notes (Signed)
Cc PCP 

## 2013-01-14 NOTE — Progress Notes (Signed)
REVIEWED.  Pt need EGD/TCS IN OR DUE TO POLYPHARMACY AFTER AUG 10.

## 2013-01-14 NOTE — Progress Notes (Signed)
Patient is scheduled on Tues Aug 12th in the OR and I have mailed instructions to the patient

## 2013-01-17 LAB — STOOL CULTURE

## 2013-01-17 NOTE — Progress Notes (Signed)
Danielle Vazquez: Please make sure dilation is added to EGD. Thanks!

## 2013-01-17 NOTE — Progress Notes (Signed)
Danielle Vazquez you are going to have to go back in an add the ED I dont have a providers templete and it wont allow me to add it

## 2013-01-18 NOTE — Progress Notes (Signed)
Quick Note:  Cr mildly elevated at 1.19. I have attempted to call patient to ensure she is staying hydrated.  Hgb normal.  Eosinophils elevated at 15.  TCS/EGD as planned with Dr. Darrick Penna. ______

## 2013-01-19 ENCOUNTER — Encounter (HOSPITAL_COMMUNITY): Payer: Self-pay | Admitting: Pharmacy Technician

## 2013-01-19 NOTE — Progress Notes (Signed)
Quick Note:  Called and informed pt. She said she is drinking lots of water, but her mouth still stays dry all the time. ______

## 2013-01-28 ENCOUNTER — Encounter (HOSPITAL_COMMUNITY): Admission: RE | Admit: 2013-01-28 | Payer: Self-pay | Source: Ambulatory Visit

## 2013-01-28 ENCOUNTER — Telehealth: Payer: Self-pay | Admitting: Gastroenterology

## 2013-01-28 ENCOUNTER — Ambulatory Visit: Payer: Self-pay | Admitting: Gastroenterology

## 2013-01-28 NOTE — Telephone Encounter (Signed)
Looks like duplicate appointment. Patient seen by Tobi Bastos last month and is scheduled for procedures next week.

## 2013-01-28 NOTE — Telephone Encounter (Signed)
Pt was a no show

## 2013-01-31 ENCOUNTER — Encounter (HOSPITAL_COMMUNITY)
Admission: RE | Admit: 2013-01-31 | Discharge: 2013-01-31 | Disposition: A | Discharge status: Home / Self Care | Payer: Self-pay | Source: Ambulatory Visit | Attending: Gastroenterology | Admitting: Gastroenterology

## 2013-01-31 ENCOUNTER — Encounter (HOSPITAL_COMMUNITY): Payer: Self-pay

## 2013-01-31 DIAGNOSIS — R112 Nausea with vomiting, unspecified: Secondary | ICD-10-CM | POA: Insufficient documentation

## 2013-01-31 DIAGNOSIS — Z01818 Encounter for other preprocedural examination: Secondary | ICD-10-CM | POA: Insufficient documentation

## 2013-01-31 DIAGNOSIS — Z01812 Encounter for preprocedural laboratory examination: Secondary | ICD-10-CM | POA: Insufficient documentation

## 2013-01-31 LAB — BASIC METABOLIC PANEL
BUN: 6 mg/dL (ref 6–23)
Chloride: 103 mEq/L (ref 96–112)
GFR calc non Af Amer: 65 mL/min — ABNORMAL LOW (ref >90)
Glucose, Bld: 97 mg/dL (ref 70–99)
Potassium: 4.1 mEq/L (ref 3.5–5.1)
Sodium: 138 mEq/L (ref 135–145)

## 2013-01-31 NOTE — Patient Instructions (Addendum)
PARADISE VENSEL  01/31/2013   Your procedure is scheduled on:  02/01/2013  Report to [facility] at  815  AM.  Call this number if you have problems the morning of surgery: [phone]   Remember:   Do not eat food or drink liquids after midnight.   Take these medicines the morning of surgery with A SIP OF WATER: valium, zofran, roxycodone, phnergan   Do not wear jewelry, make-up or nail polish.  Do not wear lotions, powders, or perfumes.   Do not shave 48 hours prior to surgery. Men may shave face and neck.  Do not bring valuables to the hospital.  [facility] is not responsible  for any belongings or valuables.  Contacts, dentures or bridgework may not be worn into surgery.  Leave suitcase in the car. After surgery it may be brought to your room.  For patients admitted to the hospital, checkout time is 11:00 AM the day of discharge.   Patients discharged the day of surgery will not be allowed to drive home.  Name and phone number of your driver: family  Special Instructions: N/A   Please read over the following fact sheets that you were given: Pain Booklet, Coughing and Deep Breathing, Surgical Site Infection Prevention, Anesthesia Post-op Instructions and Care and Recovery After Surgery Colonoscopy A colonoscopy is an exam to evaluate your entire colon. In this exam, your colon is cleansed. A long fiberoptic tube is inserted through your rectum and into your colon. The fiberoptic scope (endoscope) is a long bundle of enclosed and very flexible fibers. These fibers transmit light to the area examined and send images from that area to your caregiver. Discomfort is usually minimal. You may be given a drug to help you sleep (sedative) during or prior to the procedure. This exam helps to detect lumps (tumors), polyps, inflammation, and areas of bleeding. Your caregiver may also take a small piece of tissue (biopsy) that will be examined under a microscope. LET YOUR CAREGIVER KNOW ABOUT:    Allergies to food or medicine.  Medicines taken, including vitamins, herbs, eyedrops, over-the-counter medicines, and creams.  Use of steroids (by mouth or creams).  Previous problems with anesthetics or numbing medicines.  History of bleeding problems or blood clots.  Previous surgery.  Other health problems, including diabetes and kidney problems.  Possibility of pregnancy, if this applies. BEFORE THE PROCEDURE   A clear liquid diet may be required for 2 days before the exam.  Ask your caregiver about changing or stopping your regular medications.  Liquid injections (enemas) or laxatives may be required.  A large amount of electrolyte solution may be given to you to drink over a short period of time. This solution is used to clean out your colon.  You should be present 60 minutes prior to your procedure or as directed by your caregiver. AFTER THE PROCEDURE   If you received a sedative or pain relieving medication, you will need to arrange for someone to drive you home.  Occasionally, there is a little blood passed with the first bowel movement. Do not be concerned. FINDING OUT THE RESULTS OF YOUR TEST Not all test results are available during your visit. If your test results are not back during the visit, make an appointment with your caregiver to find out the results. Do not assume everything is normal if you have not heard from your caregiver or the medical facility. It is important for you to follow  up on all of your test results. HOME CARE INSTRUCTIONS   It is not unusual to pass moderate amounts of gas and experience mild abdominal cramping following the procedure. This is due to air being used to inflate your colon during the exam. Walking or a warm pack on your belly (abdomen) may help.  You may resume all normal meals and activities after sedatives and medicines have worn off.  Only take over-the-counter or prescription medicines for pain, discomfort, or fever as  directed by your caregiver. Do not use aspirin or blood thinners if a biopsy was taken. Consult your caregiver for medicine usage if biopsies were taken. SEEK IMMEDIATE MEDICAL CARE IF:   You have a fever.  You pass large blood clots or fill a toilet with blood following the procedure. This may also occur 10 to 14 days following the procedure. This is more likely if a biopsy was taken.  You develop abdominal pain that keeps getting worse and cannot be relieved with medicine. Document Released: 06/06/2000 Document Revised: 09/01/2011 Document Reviewed: 01/20/2008 [facility] Patient Information 2014 [place], [place]. Esophagogastroduodenoscopy Esophagogastroduodenoscopy (EGD) is a procedure to examine the lining of the esophagus, stomach, and first part of the small intestine (duodenum). A long, flexible, lighted tube with a camera attached (endoscope) is inserted down the throat to view these organs. This procedure is done to detect problems or abnormalities, such as inflammation, bleeding, ulcers, or growths, in order to treat them. The procedure lasts about 5 20 minutes. It is usually an outpatient procedure, but it may need to be performed in emergency cases in the hospital. LET YOUR CAREGIVER KNOW ABOUT:   Allergies to food or medicine.  All medicines you are taking, including vitamins, herbs, eyedrops, and over-the-counter medicines and creams.  Use of steroids (by mouth or creams).  Previous problems you or members of your family have had with the use of anesthetics.  Any blood disorders you have.  Previous surgeries you have had.  Other health problems you have.  Possibility of pregnancy, if this applies. RISKS AND COMPLICATIONS  Generally, EGD is a safe procedure. However, as with any procedure, complications can occur. Possible complications include:  Infection.  Bleeding.  Tearing (perforation) of the esophagus, stomach, or duodenum.  Difficulty breathing or not being able  to breath.  Excessive sweating.  Spasms of the larynx.  Slowed heartbeat.  Low blood pressure. BEFORE THE PROCEDURE  Do not eat or drink anything for 6 8 hours before the procedure or as directed by your caregiver.  Ask your caregiver about changing or stopping your regular medicines.  If you wear dentures, be prepared to remove them before the procedure.  Arrange for someone to drive you home after the procedure. PROCEDURE   A vein will be accessed to give medicines and fluids. A medicine to relax you (sedative) and a pain reliever will be given through that access into the vein.  A numbing medicine (local anesthetic) may be sprayed on your throat for comfort and to stop you from gagging or coughing.  A mouth guard may be placed in your mouth to protect your teeth and to keep you from biting on the endoscope.  You will be asked to lie on your left side.  The endoscope is inserted down your throat and into the esophagus, stomach, and duodenum.  Air is put through the endoscope to allow your caregiver to view the lining of your esophagus clearly.  The esophagus, stomach, and duodenum is then examined. During  the exam, your caregiver may:  Remove tissue to be examined under a microscope (biopsy) for inflammation, infection, or other medical problems.  Remove growths.  Remove objects (foreign bodies) that are stuck.  Treat any bleeding with medicines or other devices that stop tissues from bleeding (hot cauters, clipping devices).  Widen (dilate) or stretch narrowed areas of the esophagus and stomach.  The endoscope will then be withdrawn. AFTER THE PROCEDURE  You will be taken to a recovery area to be monitored. You will be able to go home once you are stable and alert.  Do not eat or drink anything until the local anesthetic and numbing medicines have worn off. You may choke.  It is normal to feel bloated, have pain with swallowing, or have a sore throat for a short  time. This will wear off.  Your caregiver should be able to discuss his or her findings with you. It will take longer to discuss the test results if any biopsies were taken. Document Released: 10/10/2004 Document Revised: 05/26/2012 Document Reviewed: 05/12/2012 [facility] Patient Information 2014 [place], [place]. PATIENT INSTRUCTIONS POST-ANESTHESIA  IMMEDIATELY FOLLOWING SURGERY:  Do not drive or operate machinery for the first twenty four hours after surgery.  Do not make any important decisions for twenty four hours after surgery or while taking narcotic pain medications or sedatives.  If you develop intractable nausea and vomiting or a severe headache please notify your doctor immediately.  FOLLOW-UP:  Please make an appointment with your surgeon as instructed. You do not need to follow up with anesthesia unless specifically instructed to do so.  WOUND CARE INSTRUCTIONS (if applicable):  Keep a dry clean dressing on the anesthesia/puncture wound site if there is drainage.  Once the wound has quit draining you may leave it open to air.  Generally you should leave the bandage intact for twenty four hours unless there is drainage.  If the epidural site drains for more than 36-48 hours please call the anesthesia department.  QUESTIONS?:  Please feel free to call your physician or the hospital operator if you have any questions, and they will be happy to assist you.

## 2013-02-01 ENCOUNTER — Encounter (HOSPITAL_COMMUNITY)
Admission: RE | Disposition: A | Discharge status: Home / Self Care | Payer: Self-pay | Source: Ambulatory Visit | Attending: Gastroenterology

## 2013-02-01 ENCOUNTER — Ambulatory Visit (HOSPITAL_COMMUNITY): Payer: Self-pay | Admitting: Anesthesiology

## 2013-02-01 ENCOUNTER — Encounter (HOSPITAL_COMMUNITY): Payer: Self-pay | Admitting: Anesthesiology

## 2013-02-01 ENCOUNTER — Encounter (HOSPITAL_COMMUNITY): Payer: Self-pay | Admitting: *Deleted

## 2013-02-01 ENCOUNTER — Ambulatory Visit (HOSPITAL_COMMUNITY)
Admission: RE | Admit: 2013-02-01 | Discharge: 2013-02-01 | Disposition: A | Discharge status: Home / Self Care | Payer: Self-pay | Source: Ambulatory Visit | Attending: Gastroenterology | Admitting: Gastroenterology

## 2013-02-01 DIAGNOSIS — R109 Unspecified abdominal pain: Secondary | ICD-10-CM | POA: Insufficient documentation

## 2013-02-01 DIAGNOSIS — R1013 Epigastric pain: Secondary | ICD-10-CM | POA: Insufficient documentation

## 2013-02-01 DIAGNOSIS — K5730 Diverticulosis of large intestine without perforation or abscess without bleeding: Secondary | ICD-10-CM | POA: Insufficient documentation

## 2013-02-01 DIAGNOSIS — D128 Benign neoplasm of rectum: Secondary | ICD-10-CM | POA: Insufficient documentation

## 2013-02-01 DIAGNOSIS — K2980 Duodenitis without bleeding: Secondary | ICD-10-CM | POA: Insufficient documentation

## 2013-02-01 DIAGNOSIS — R1310 Dysphagia, unspecified: Secondary | ICD-10-CM | POA: Insufficient documentation

## 2013-02-01 DIAGNOSIS — R197 Diarrhea, unspecified: Secondary | ICD-10-CM

## 2013-02-01 DIAGNOSIS — K2990 Gastroduodenitis, unspecified, without bleeding: Secondary | ICD-10-CM

## 2013-02-01 DIAGNOSIS — K620 Anal polyp: Secondary | ICD-10-CM

## 2013-02-01 DIAGNOSIS — K621 Rectal polyp: Secondary | ICD-10-CM

## 2013-02-01 DIAGNOSIS — K2970 Gastritis, unspecified, without bleeding: Secondary | ICD-10-CM

## 2013-02-01 DIAGNOSIS — K3189 Other diseases of stomach and duodenum: Secondary | ICD-10-CM | POA: Insufficient documentation

## 2013-02-01 DIAGNOSIS — K222 Esophageal obstruction: Secondary | ICD-10-CM

## 2013-02-01 DIAGNOSIS — K2940 Chronic atrophic gastritis without bleeding: Secondary | ICD-10-CM | POA: Insufficient documentation

## 2013-02-01 DIAGNOSIS — K648 Other hemorrhoids: Secondary | ICD-10-CM | POA: Insufficient documentation

## 2013-02-01 HISTORY — PX: ESOPHAGOGASTRODUODENOSCOPY (EGD) WITH ESOPHAGEAL DILATION: SHX5812

## 2013-02-01 HISTORY — PX: COLONOSCOPY WITH PROPOFOL: SHX5780

## 2013-02-01 SURGERY — COLONOSCOPY WITH PROPOFOL
Anesthesia: Monitor Anesthesia Care | Site: Rectum

## 2013-02-01 MED ORDER — FENTANYL CITRATE 0.05 MG/ML IJ SOLN: 25.0000 ug | INTRAMUSCULAR | Status: DC | PRN

## 2013-02-01 MED ORDER — GLYCOPYRROLATE 0.2 MG/ML IJ SOLN
INTRAMUSCULAR | Status: AC
  Filled 2013-02-01: qty 1

## 2013-02-01 MED ORDER — MIDAZOLAM HCL 2 MG/2ML IJ SOLN
INTRAMUSCULAR | Status: AC
  Filled 2013-02-01: qty 2

## 2013-02-01 MED ORDER — FENTANYL CITRATE 0.05 MG/ML IJ SOLN
INTRAMUSCULAR | Status: AC
  Filled 2013-02-01: qty 2

## 2013-02-01 MED ORDER — STERILE WATER FOR IRRIGATION IR SOLN
Status: DC | PRN
  Administered 2013-02-01: 09:00:00

## 2013-02-01 MED ORDER — PROPOFOL 10 MG/ML IV EMUL
INTRAVENOUS | Status: AC
  Filled 2013-02-01: qty 20

## 2013-02-01 MED ORDER — LIDOCAINE HCL (PF) 1 % IJ SOLN
INTRAMUSCULAR | Status: AC
  Filled 2013-02-01: qty 5

## 2013-02-01 MED ORDER — LACTATED RINGERS IV SOLN
INTRAVENOUS | Status: DC | PRN
  Administered 2013-02-01: 08:00:00 via INTRAVENOUS

## 2013-02-01 MED ORDER — MIDAZOLAM HCL 2 MG/2ML IJ SOLN
1.0000 mg | INTRAMUSCULAR | Status: DC | PRN
Start: 2013-02-01 — End: 2013-02-01
  Administered 2013-02-01 (×2): 2 mg via INTRAVENOUS

## 2013-02-01 MED ORDER — ONDANSETRON HCL 4 MG/2ML IJ SOLN
INTRAMUSCULAR | Status: AC
  Filled 2013-02-01: qty 2

## 2013-02-01 MED ORDER — ONDANSETRON HCL 4 MG/2ML IJ SOLN: 4.0000 mg | Freq: Once | INTRAMUSCULAR | Status: DC | PRN

## 2013-02-01 MED ORDER — PROPOFOL INFUSION 10 MG/ML OPTIME
INTRAVENOUS | Status: DC | PRN
  Administered 2013-02-01: 125 ug/kg/min via INTRAVENOUS
  Administered 2013-02-01: 75 ug/kg/min via INTRAVENOUS

## 2013-02-01 MED ORDER — ONDANSETRON HCL 4 MG/2ML IJ SOLN
4.0000 mg | Freq: Once | INTRAMUSCULAR | Status: AC
  Administered 2013-02-01: 4 mg via INTRAVENOUS

## 2013-02-01 MED ORDER — BUTAMBEN-TETRACAINE-BENZOCAINE 2-2-14 % EX AERO
1.0000 | INHALATION_SPRAY | Freq: Once | CUTANEOUS | Status: AC
  Administered 2013-02-01: 1 via TOPICAL
  Filled 2013-02-01: qty 56

## 2013-02-01 MED ORDER — LACTATED RINGERS IV SOLN
INTRAVENOUS | Status: DC
  Administered 2013-02-01: 08:00:00 via INTRAVENOUS

## 2013-02-01 MED ORDER — GLYCOPYRROLATE 0.2 MG/ML IJ SOLN
0.2000 mg | Freq: Once | INTRAMUSCULAR | Status: AC
  Administered 2013-02-01: 0.2 mg via INTRAVENOUS

## 2013-02-01 MED ORDER — FENTANYL CITRATE 0.05 MG/ML IJ SOLN
25.0000 ug | INTRAMUSCULAR | Status: AC
Start: 2013-02-01 — End: 2013-02-01
  Administered 2013-02-01 (×2): 25 ug via INTRAVENOUS

## 2013-02-01 MED ORDER — FENTANYL CITRATE 0.05 MG/ML IJ SOLN
INTRAMUSCULAR | Status: DC | PRN
  Administered 2013-02-01: 50 ug via INTRAVENOUS
  Administered 2013-02-01 (×2): 25 ug via INTRAVENOUS

## 2013-02-01 MED ORDER — MIDAZOLAM HCL 5 MG/5ML IJ SOLN
INTRAMUSCULAR | Status: DC | PRN
  Administered 2013-02-01 (×2): 1 mg via INTRAVENOUS

## 2013-02-01 MED ORDER — HYOSCYAMINE SULFATE 0.125 MG SL SUBL: 0.1250 mg | SUBLINGUAL_TABLET | SUBLINGUAL | Status: DC | PRN

## 2013-02-01 SURGICAL SUPPLY — 23 items
BLOCK BITE 60FR ADLT L/F BLUE (MISCELLANEOUS) ×4 IMPLANT
ELECT REM PT RETURN 9FT ADLT (ELECTROSURGICAL)
ELECTRODE REM PT RTRN 9FT ADLT (ELECTROSURGICAL) IMPLANT
FCP BXJMBJMB 240X2.8X (CUTTING FORCEPS)
FLOOR PAD 36X40 (MISCELLANEOUS) ×4
FORCEPS BIOP RAD 4 LRG CAP 4 (CUTTING FORCEPS) ×4 IMPLANT
FORCEPS BIOP RJ4 240 W/NDL (CUTTING FORCEPS)
FORCEPS BXJMBJMB 240X2.8X (CUTTING FORCEPS) IMPLANT
FORMALIN 10 PREFIL 20ML (MISCELLANEOUS) IMPLANT
INJECTOR/SNARE I SNARE (MISCELLANEOUS) IMPLANT
LUBRICANT JELLY 4.5OZ STERILE (MISCELLANEOUS) ×4 IMPLANT
MANIFOLD NEPTUNE II (INSTRUMENTS) ×4 IMPLANT
NEEDLE SCLEROTHERAPY 25GX240 (NEEDLE) IMPLANT
PAD FLOOR 36X40 (MISCELLANEOUS) ×3 IMPLANT
PROBE APC STR FIRE (PROBE) IMPLANT
PROBE INJECTION GOLD (MISCELLANEOUS)
PROBE INJECTION GOLD 7FR (MISCELLANEOUS) IMPLANT
SNARE SHORT THROW 13M SML OVAL (MISCELLANEOUS) ×4 IMPLANT
SYR 50ML LL SCALE MARK (SYRINGE) ×4 IMPLANT
TRAP SPECIMEN MUCOUS 40CC (MISCELLANEOUS) IMPLANT
TUBING ENDO SMARTCAP PENTAX (MISCELLANEOUS) ×4 IMPLANT
TUBING IRRIGATION ENDOGATOR (MISCELLANEOUS) ×4 IMPLANT
WATER STERILE IRR 1000ML POUR (IV SOLUTION) ×4 IMPLANT

## 2013-02-01 NOTE — H&P (Signed)
Primary Care Physician:  Cassell Smiles., MD Primary Gastroenterologist:  Dr. Darrick Penna  Pre-Procedure History & Physical: HPI:  Danielle Vazquez is a 42 y.o. female here for NAUSEA/VOMTIING/DIARRHEA/ABDOMINAL PAIN-ELEVATED EOS Past Medical History  Diagnosis Date  . Anxiety   . GERD (gastroesophageal reflux disease)   . Chronic pain syndrome   . DDD (degenerative disc disease)     has had injections in past  . Degenerative disc disease   . Gastritis   . Diverticulosis   . Hiatal hernia     Past Surgical History  Procedure Laterality Date  . Cholecystectomy  2009  . Tubal ligation  1993  . Ileocolonoscopy  09/23/2011    NWG:NFAOZH, multiple in the rectum/Polyps, multiple in the distal transverse colon/Diverticulosis,mild,left sided diverticulosis/Internal hemorrhoids/path: tubular adenomas  . Esophagogastroduodenoscopy  April 2013    SLF: esophageal stricture, gastritis, hiatal hernia, negative H.pylori    Prior to Admission medications   Medication Sig Start Date End Date Taking? Authorizing Provider  diazepam (VALIUM) 10 MG tablet Take 10 mg by mouth 4 (four) times daily.   Yes Historical Provider, MD  hyoscyamine (LEVSIN SL) 0.125 MG SL tablet Place 1 tablet (0.125 mg total) under the tongue every 4 (four) hours as needed for cramping. 01/13/13  Yes Nira Retort, NP  ibuprofen (ADVIL,MOTRIN) 200 MG tablet Take 400 mg by mouth every 8 (eight) hours as needed. Fever/aches and pain   Yes Historical Provider, MD  megestrol (MEGACE) 40 MG tablet Take 40 mg by mouth daily.   Yes Historical Provider, MD  ondansetron (ZOFRAN) 4 MG tablet Take 1 tablet (4 mg total) by mouth every 8 (eight) hours as needed for nausea. 12/31/12  Yes Laray Anger, DO  oxycodone (ROXICODONE) 30 MG immediate release tablet Take 30 mg by mouth every 3 (three) hours as needed. pain   Yes Historical Provider, MD  promethazine (PHENERGAN) 25 MG tablet Take 1 tablet (25 mg total) by mouth every 6 (six) hours as  needed for nausea. 01/12/13  Yes Nira Retort, NP    Allergies as of 01/14/2013 - Review Complete 01/12/2013  Allergen Reaction Noted  . Codeine Nausea Only and Rash 09/09/2010    Family History  Problem Relation Age of Onset  . Heart attack Mother   . Heart attack Father   . Colon cancer Neg Hx   . Anesthesia problems Neg Hx   . Hypotension Neg Hx   . Malignant hyperthermia Neg Hx   . Pseudochol deficiency Neg Hx     History   Social History  . Marital Status: Single    Spouse Name: N/A    Number of Children: 3  . Years of Education: N/A   Occupational History  . unemployed    Social History Main Topics  . Smoking status: Current Every Day Smoker -- 0.50 packs/day for 25 years    Types: Cigarettes  . Smokeless tobacco: Not on file  . Alcohol Use: No     Comment: rarely  . Drug Use: No  . Sexually Active: Yes    Birth Control/ Protection: Surgical, Other-see comments   Other Topics Concern  . Not on file   Social History Narrative  . No narrative on file    Review of Systems: See HPI, otherwise negative ROS   Physical Exam: BP 109/76  Temp(Src) 97.6 F (36.4 C) (Oral)  Resp 18  Ht 5\' 6"  (1.676 m)  Wt 156 lb (70.761 kg)  BMI 25.19 kg/m2  SpO2 97%  General:   Alert,  pleasant and cooperative in NAD Head:  Normocephalic and atraumatic. Neck:  Supple; Lungs:  Clear throughout to auscultation.    Heart:  Regular rate and rhythm. Abdomen:  Soft, nontender and nondistended. Normal bowel sounds, without guarding, and without rebound.   Neurologic:  Alert and  oriented x4;  grossly normal neurologically.  Impression/Plan:     NAUSEA/VOMTIING/DIARRHEA/ABDOMINAL PAIN  PLAN:  1. EGD/TCS TODAY

## 2013-02-01 NOTE — Anesthesia Procedure Notes (Signed)
Procedure Name: MAC Date/Time: 02/01/2013 9:09 AM Performed by: Carolyne Littles, Leland Raver L Pre-anesthesia Checklist: Patient identified, Timeout performed, Emergency Drugs available, Suction available and Patient being monitored Patient Re-evaluated:Patient Re-evaluated prior to inductionOxygen Delivery Method: Non-rebreather mask

## 2013-02-01 NOTE — Anesthesia Postprocedure Evaluation (Signed)
  Anesthesia Post-op Note  Patient: Danielle Vazquez  Procedure(s) Performed: Procedure(s) with comments: COLONOSCOPY WITH PROPOFOL (N/A) - in cecum @ 0930 ; cecal withdrawal time = 17 minutes ESOPHAGOGASTRODUODENOSCOPY (EGD) WITH ESOPHAGEAL DILATION (N/A) - #15,16 savory dilators  Patient Location: PACU  Anesthesia Type:MAC  Level of Consciousness: awake, alert , oriented and patient cooperative  Airway and Oxygen Therapy: Patient Spontanous Breathing and Patient connected to face mask oxygen  Post-op Pain: none  Post-op Assessment: Post-op Vital signs reviewed, Patient's Cardiovascular Status Stable, Respiratory Function Stable, Patent Airway, No signs of Nausea or vomiting and Adequate PO intake  Post-op Vital Signs: Reviewed and stable  Complications: No apparent anesthesia complications

## 2013-02-01 NOTE — Anesthesia Preprocedure Evaluation (Signed)
Anesthesia Evaluation  Patient identified by MRN, date of birth, ID band Patient awake    Reviewed: Allergy & Precautions, H&P , NPO status , Patient's Chart, lab work & pertinent test results  History of Anesthesia Complications Negative for: history of anesthetic complications  Airway Mallampati: III  Neck ROM: Full  Mouth opening: Limited Mouth Opening  Dental  (+) Missing   Pulmonary Current Smoker,  breath sounds clear to auscultation        Cardiovascular Rhythm:Regular     Neuro/Psych  Headaches, Anxiety    GI/Hepatic GERD-  Medicated,  Endo/Other    Renal/GU      Musculoskeletal   Abdominal   Peds  Hematology   Anesthesia Other Findings   Reproductive/Obstetrics                           Anesthesia Physical Anesthesia Plan  ASA: II  Anesthesia Plan: MAC   Post-op Pain Management:    Induction: Intravenous  Airway Management Planned: Simple Face Mask  Additional Equipment:   Intra-op Plan:   Post-operative Plan:   Informed Consent: I have reviewed the patients History and Physical, chart, labs and discussed the procedure including the risks, benefits and alternatives for the proposed anesthesia with the patient or authorized representative who has indicated his/her understanding and acceptance.     Plan Discussed with:   Anesthesia Plan Comments:         Anesthesia Quick Evaluation

## 2013-02-01 NOTE — Transfer of Care (Signed)
Immediate Anesthesia Transfer of Care Note  Patient: Danielle Vazquez  Procedure(s) Performed: Procedure(s) with comments: COLONOSCOPY WITH PROPOFOL (N/A) - in cecum @ 0930 ; cecal withdrawal time = 17 minutes ESOPHAGOGASTRODUODENOSCOPY (EGD) WITH ESOPHAGEAL DILATION (N/A) - #15,16 savory dilators  Patient Location: PACU  Anesthesia Type:MAC  Level of Consciousness: awake, alert , oriented and patient cooperative  Airway & Oxygen Therapy: Patient Spontanous Breathing and Patient connected to face mask oxygen  Post-op Assessment: Report given to PACU RN and Post -op Vital signs reviewed and stable  Post vital signs: Reviewed and stable  Complications: No apparent anesthesia complications

## 2013-02-01 NOTE — Preoperative (Signed)
Beta Blockers   Reason not to administer Beta Blockers:Not Applicable 

## 2013-02-02 ENCOUNTER — Other Ambulatory Visit: Payer: Self-pay | Admitting: Obstetrics & Gynecology

## 2013-02-02 NOTE — Op Note (Signed)
[facility] [address] [place], [address]   ENDOSCOPY PROCEDURE REPORT  PATIENT: Ernestene, Coover  MR#: [id number] BIRTHDATE: [birth year] , 42  yrs. old GENDER: Female  ENDOSCOPIST: Jonette Eva, MD REFFERED XB:JYNWG Sherwood Gambler, M.D.  PROCEDURE DATE:  02/01/2013 PROCEDURE:   EGD with biopsy and EGD with dilatation over guidewire   INDICATIONS:1.  dysphagia.   2.  dyspepsia. MEDICATIONS: MAC sedation, administered by CRNA TOPICAL ANESTHETIC: Cetacaine Spray  DESCRIPTION OF PROCEDURE:   After the risks benefits and alternatives of the procedure were thoroughly explained, informed consent was obtained.  The     endoscope was introduced through the mouth and advanced to the second portion of the duodenum. The instrument was slowly withdrawn as the mucosa was carefully examined.  Prior to withdrawal of the scope, the guidwire was placed.  The esophagus was dilated successfully.  The patient was recovered in endoscopy and discharged home in satisfactory condition.   ESOPHAGUS: A stricture was found at the gastroesophageal junction. The stenosis was traversable with the endoscope.   BIOPSIES OBTAINED 15 AND 30 CM FROM THE TEETH. GE JXN 35 CM FROM THE TEETH.STOMACH: Mild non-erosive gastritis (inflammation) was found in the gastric antrum.  Multiple biopsies were performed using cold forceps.   DUODENUM: Moderate duodenal inflammation was found in the duodenal bulb.   The duodenal mucosa showed no abnormalities in the 2nd part of the duodenum.  Cold forcep biopsies were taken in the second portion.   Dilation was then performed at the gastroesphageal junction Dilator: Savary over guidewire Size(s): 15-16 mm Resistance: minimal Heme: yes  COMPLICATIONS: There were no complications.   ENDOSCOPIC IMPRESSION: 1.   Stricture at the gastroesophageal junction 2.   MILD Non-erosive gastritis & MODERATE DUODENITIS  RECOMMENDATIONS: CONTINUE NEXIUM.  TAKE 30 MINUTES  BEFORE MEALS. EAT ONE TUMS WITH MEALS THREE TIMES A DAY. STOP SMOKING. TAKE LEVSIN 30 MINUTES PRIOR TO MEALS AND AT BEDTIME. FOLLOW A LOW FAT/HIGH FIBER DIET.  AVOID ITEMS THAT CAUSE BLOATING.  BIOPSY WILL BE BACK IN 7 DAYS. Follow up in OCT 2014.      _______________________________ eSigned:  Jonette Eva, MD 02/01/2013 4:44 PM      PATIENT NAME:  Ameah, Chanda MR#: [id number]

## 2013-02-02 NOTE — Op Note (Signed)
[facility] [address] [place], [address]   COLONOSCOPY PROCEDURE REPORT  PATIENT: Danielle, Vazquez  MR#: [id number] BIRTHDATE: [birth year] , 42  yrs. old GENDER: Female ENDOSCOPIST: Jonette Eva, MD REFERRED XB:JYNWG Sherwood Gambler, M.D. PROCEDURE DATE:  02/01/2013 PROCEDURE:   Colonoscopy with biopsy INDICATIONS:unexplained diarrhea and Abdominal pain. MEDICATIONS: MAC sedation, administered by CRNA  DESCRIPTION OF PROCEDURE:    Physical exam was performed.  Informed consent was obtained from the patient after explaining the benefits, risks, and alternatives to procedure.  The patient was connected to monitor and placed in left lateral position. Continuous oxygen was provided by nasal cannula and IV medicine administered through an indwelling cannula.  After administration of sedation and rectal exam, the patients rectum was intubated and the     colonoscope was advanced under direct visualization to the ileum.  The scope was removed slowly by carefully examining the color, texture, anatomy, and integrity mucosa on the way out.  The patient was recovered in endoscopy and discharged home in satisfactory condition.     COLON FINDINGS: The mucosa appeared normal in the terminal ileum.  , There was moderate diverticulosis noted in the sigmoid colon with associated luminal narrowing.  , A sessile polyp measuring 3 mm in size was found in the rectum.  A polypectomy was performed with cold forceps.  , A normal appearing cecum, ileocecal valve, and appendiceal orifice were identified.  The ascending, hepatic flexure, transverse, splenic flexure, AND descending colon and rectum appeared unremarkable.  No cancers were seen.  Multiple biopsies were performed. TO EVALUATE FOR MICROSCOPIC COLITIS  , and Small internal hemorrhoids were found.  PREP QUALITY: good. CECAL W/D TIME: 17 minutes  COMPLICATIONS: None  ENDOSCOPIC IMPRESSION: 1.   Normal mucosa in the terminal  ileum 2.   Moderate diverticulosis in the sigmoid colon 3.   ONE RECTAL POLYP REMOVED 4.   Small internal hemorrhoids   RECOMMENDATIONS: CONTINUE NEXIUM.  TAKE 30 MINUTES BEFORE MEALS. EAT ONE TUMS WITH MEALS THREE TIMES A DAY. STOP SMOKING. TAKE LEVSIN 30 MINUTES PRIOR TO MEALS AND AT BEDTIME. FOLLOW A LOW FAT/HIGH FIBER DIET.  AVOID ITEMS THAT CAUSE BLOATING.  BIOPSY WILL BE BACK IN 7 DAYS. Follow up in OCT 2014.       _______________________________ eSigned:  Jonette Eva, MD 02/01/2013 4:48 PM     PATIENT NAME:  Danielle, Vazquez MR#: [id number]

## 2013-02-03 ENCOUNTER — Encounter (HOSPITAL_COMMUNITY): Payer: Self-pay | Admitting: Gastroenterology

## 2013-02-10 ENCOUNTER — Telehealth: Payer: Self-pay | Admitting: Gastroenterology

## 2013-02-10 NOTE — Telephone Encounter (Signed)
Pt returned call and was informed of results.  

## 2013-02-10 NOTE — Telephone Encounter (Signed)
AT NEXT OPV IF DIARRHEA NOT IMPROVED. CONSIDER HBT FOR SIBO.

## 2013-02-10 NOTE — Telephone Encounter (Signed)
Please call pt. Her colon and small bowel biopsies are normal. HER STOMACH BIOPSIES SHOW  GASTRITIS. HER ESOPHAGUS BIOPSIES SHOW CHANGES CONSISTENT WITH REFLUX. HER DIARRHEA IS MOST LIKELY DUE TO HER NOT HAVING A GALLBLADDER AND IBS. HER NAUSEA AND VOMITING ARE MOST LIKELY DUE TO REFLUX.     CONTINUE NEXIUM. TAKE 30 MINUTES BEFORE MEALS TWICE DAILY. EAT ONE TUMS WITH MEALS THREE TIMES A DAY. STOP SMOKING. TAKE LEVSIN 30 MINUTES PRIOR TO MEALS AND AT BEDTIME.  FOLLOW A LOW FAT/HIGH FIBER DIET.  AVOID ITEMS THAT CAUSE BLOATING.  Follow up in OCT 2014.  NEXT TCS IN 10 YEARS WITH MAC.

## 2013-02-10 NOTE — Telephone Encounter (Signed)
LMOM for a return call.  

## 2013-02-11 NOTE — Telephone Encounter (Signed)
REMINDER APPT MADE 

## 2013-03-02 ENCOUNTER — Other Ambulatory Visit: Payer: Self-pay | Admitting: Obstetrics & Gynecology

## 2013-03-10 ENCOUNTER — Ambulatory Visit: Payer: Self-pay | Admitting: Gastroenterology

## 2013-03-10 ENCOUNTER — Telehealth: Payer: Self-pay | Admitting: Gastroenterology

## 2013-03-10 NOTE — Telephone Encounter (Signed)
REVIEWED.  

## 2013-03-10 NOTE — Telephone Encounter (Signed)
Pt was a no show

## 2013-03-13 NOTE — Telephone Encounter (Signed)
OPV OCT 2014 E30 DIARRHEA, VOMITING, ABD PAIN

## 2013-03-14 ENCOUNTER — Encounter: Payer: Self-pay | Admitting: General Practice

## 2013-03-14 ENCOUNTER — Encounter: Payer: Self-pay | Admitting: Gastroenterology

## 2013-03-14 NOTE — Telephone Encounter (Signed)
NO SHOW LETTER MAILED

## 2013-04-05 ENCOUNTER — Ambulatory Visit: Payer: Self-pay | Admitting: Gastroenterology

## 2013-04-07 ENCOUNTER — Ambulatory Visit: Payer: Self-pay | Admitting: Gastroenterology

## 2013-04-07 ENCOUNTER — Telehealth: Payer: Self-pay | Admitting: Gastroenterology

## 2013-04-07 NOTE — Telephone Encounter (Signed)
Pt was a no show

## 2013-04-07 NOTE — Telephone Encounter (Signed)
REVIEWED.  

## 2013-05-16 ENCOUNTER — Other Ambulatory Visit: Payer: Self-pay | Admitting: Obstetrics & Gynecology

## 2013-05-16 MED ORDER — MEGESTROL ACETATE 40 MG PO TABS: ORAL_TABLET | ORAL | Status: DC

## 2013-05-16 NOTE — Addendum Note (Signed)
Addended by: Lazaro Arms on: 05/16/2013 04:23 PM   Modules accepted: Orders

## 2013-06-30 ENCOUNTER — Telehealth: Payer: Self-pay | Admitting: Women's Health

## 2013-06-30 NOTE — Telephone Encounter (Signed)
Spoke with pt. Has been on Megace x 1 year. Started bleeding Saturday and its just getting heavier. Pt also having stomach pain and backpain. Call transferred to front desk to schedule an appt. [place]

## 2013-07-05 ENCOUNTER — Ambulatory Visit: Payer: Self-pay | Admitting: Obstetrics & Gynecology

## 2013-07-05 ENCOUNTER — Encounter (INDEPENDENT_AMBULATORY_CARE_PROVIDER_SITE_OTHER): Payer: Self-pay

## 2013-09-06 NOTE — Progress Notes (Signed)
REVIEWED. EGD/DILL AUG 2014 ESO Bx: GERD, GASTRITIS, NL SB BX Results TCS AUG 2014 NL COLON Bx, TICS/IH, HYPERPLASTIC POLYP

## 2013-12-02 ENCOUNTER — Emergency Department (HOSPITAL_COMMUNITY)
Admission: EM | Admit: 2013-12-02 | Discharge: 2013-12-02 | Disposition: A | Discharge status: Home / Self Care | Payer: Self-pay | Attending: Emergency Medicine | Admitting: Emergency Medicine

## 2013-12-02 ENCOUNTER — Emergency Department (HOSPITAL_COMMUNITY): Payer: Self-pay

## 2013-12-02 ENCOUNTER — Encounter (HOSPITAL_COMMUNITY): Payer: Self-pay | Admitting: Emergency Medicine

## 2013-12-02 DIAGNOSIS — Z791 Long term (current) use of non-steroidal anti-inflammatories (NSAID): Secondary | ICD-10-CM | POA: Insufficient documentation

## 2013-12-02 DIAGNOSIS — IMO0002 Reserved for concepts with insufficient information to code with codable children: Secondary | ICD-10-CM | POA: Insufficient documentation

## 2013-12-02 DIAGNOSIS — F17200 Nicotine dependence, unspecified, uncomplicated: Secondary | ICD-10-CM | POA: Insufficient documentation

## 2013-12-02 DIAGNOSIS — M25519 Pain in unspecified shoulder: Secondary | ICD-10-CM | POA: Insufficient documentation

## 2013-12-02 DIAGNOSIS — Z8719 Personal history of other diseases of the digestive system: Secondary | ICD-10-CM | POA: Insufficient documentation

## 2013-12-02 DIAGNOSIS — Z79899 Other long term (current) drug therapy: Secondary | ICD-10-CM | POA: Insufficient documentation

## 2013-12-02 DIAGNOSIS — G8929 Other chronic pain: Secondary | ICD-10-CM | POA: Insufficient documentation

## 2013-12-02 DIAGNOSIS — M5380 Other specified dorsopathies, site unspecified: Secondary | ICD-10-CM | POA: Insufficient documentation

## 2013-12-02 DIAGNOSIS — M62830 Muscle spasm of back: Secondary | ICD-10-CM

## 2013-12-02 DIAGNOSIS — F411 Generalized anxiety disorder: Secondary | ICD-10-CM | POA: Insufficient documentation

## 2013-12-02 MED ORDER — HYDROMORPHONE HCL PF 2 MG/ML IJ SOLN
2.0000 mg | Freq: Once | INTRAMUSCULAR | Status: AC
  Administered 2013-12-02: 2 mg via INTRAMUSCULAR
  Filled 2013-12-02: qty 1

## 2013-12-02 MED ORDER — DIAZEPAM 5 MG PO TABS
5.0000 mg | ORAL_TABLET | Freq: Once | ORAL | Status: AC
  Administered 2013-12-02: 5 mg via ORAL
  Filled 2013-12-02: qty 1

## 2013-12-02 NOTE — Discharge Instructions (Signed)
Musculoskeletal Pain Musculoskeletal pain is muscle and boney aches and pains. These pains can occur in any part of the body. Your caregiver may treat you without knowing the cause of the pain. They may treat you if blood or urine tests, X-rays, and other tests were normal.  CAUSES There is often not a definite cause or reason for these pains. These pains may be caused by a type of germ (virus). The discomfort may also come from overuse. Overuse includes working out too hard when your body is not fit. Boney aches also come from weather changes. Bone is sensitive to atmospheric pressure changes. HOME CARE INSTRUCTIONS   Ask when your test results will be ready. Make sure you get your test results.  Only take over-the-counter or prescription medicines for pain, discomfort, or fever as directed by your caregiver. If you were given medications for your condition, do not drive, operate machinery or power tools, or sign legal documents for 24 hours. Do not drink alcohol. Do not take sleeping pills or other medications that may interfere with treatment.  Continue all activities unless the activities cause more pain. When the pain lessens, slowly resume normal activities. Gradually increase the intensity and duration of the activities or exercise.  During periods of severe pain, bed rest may be helpful. Lay or sit in any position that is comfortable.  Putting ice on the injured area.  Put ice in a bag.  Place a towel between your skin and the bag.  Leave the ice on for 15 to 20 minutes, 3 to 4 times a day.  Follow up with your caregiver for continued problems and no reason can be found for the pain. If the pain becomes worse or does not go away, it may be necessary to repeat tests or do additional testing. Your caregiver may need to look further for a possible cause. SEEK IMMEDIATE MEDICAL CARE IF:  You have pain that is getting worse and is not relieved by medications.  You develop chest pain  that is associated with shortness or breath, sweating, feeling sick to your stomach (nauseous), or throw up (vomit).  Your pain becomes localized to the abdomen.  You develop any new symptoms that seem different or that concern you. MAKE SURE YOU:   Understand these instructions.  Will watch your condition.  Will get help right away if you are not doing well or get worse. Document Released: 06/09/2005 Document Revised: 09/01/2011 Document Reviewed: 02/11/2013 [facility] Patient Information 2014 [place].  Muscle Cramps and Spasms Muscle cramps and spasms occur when a muscle or muscles tighten and you have no control over this tightening (involuntary muscle contraction). They are a common problem and can develop in any muscle. The most common place is in the calf muscles of the leg. Both muscle cramps and muscle spasms are involuntary muscle contractions, but they also have differences:   Muscle cramps are sporadic and painful. They may last a few seconds to a quarter of an hour. Muscle cramps are often more forceful and last longer than muscle spasms.  Muscle spasms may or may not be painful. They may also last just a few seconds or much longer. CAUSES  It is uncommon for cramps or spasms to be due to a serious underlying problem. In many cases, the cause of cramps or spasms is unknown. Some common causes are:   Overexertion.   Overuse from repetitive motions (doing the same thing over and over).   Remaining in a certain position  for a long period of time.   Improper preparation, form, or technique while performing a sport or activity.   Dehydration.   Injury.   Side effects of some medicines.   Abnormally low levels of the salts and ions in your blood (electrolytes), especially potassium and calcium. This could happen if you are taking water pills (diuretics) or you are pregnant.  Some underlying medical problems can make it more likely to develop cramps or  spasms. These include, but are not limited to:   Diabetes.   Parkinson disease.   Hormone disorders, such as thyroid problems.   Alcohol abuse.   Diseases specific to muscles, joints, and bones.   Blood vessel disease where not enough blood is getting to the muscles.  HOME CARE INSTRUCTIONS   Stay well hydrated. Drink enough water and fluids to keep your urine clear or pale yellow.  It may be helpful to massage, stretch, and relax the affected muscle.  For tight or tense muscles, use a warm towel, heating pad, or hot shower water directed to the affected area.  If you are sore or have pain after a cramp or spasm, applying ice to the affected area may relieve discomfort.  Put ice in a plastic bag.  Place a towel between your skin and the bag.  Leave the ice on for 15-20 minutes, 03-04 times a day.  Medicines used to treat a known cause of cramps or spasms may help reduce their frequency or severity. Only take over-the-counter or prescription medicines as directed by your caregiver. SEEK MEDICAL CARE IF:  Your cramps or spasms get more severe, more frequent, or do not improve over time.  MAKE SURE YOU:   Understand these instructions.  Will watch your condition.  Will get help right away if you are not doing well or get worse. Document Released: 11/29/2001 Document Revised: 10/04/2012 Document Reviewed: 05/26/2012 [facility] Patient Information 2014 [place], [place].
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
## 2015-02-12 ENCOUNTER — Encounter (HOSPITAL_COMMUNITY): Payer: Self-pay | Admitting: Emergency Medicine

## 2015-02-12 ENCOUNTER — Emergency Department (HOSPITAL_COMMUNITY)
Admission: EM | Admit: 2015-02-12 | Discharge: 2015-02-12 | Disposition: A | Discharge status: Home / Self Care | Payer: Self-pay | Attending: Emergency Medicine | Admitting: Emergency Medicine

## 2015-02-12 ENCOUNTER — Emergency Department (HOSPITAL_COMMUNITY): Payer: Self-pay

## 2015-02-12 DIAGNOSIS — Y9389 Activity, other specified: Secondary | ICD-10-CM | POA: Insufficient documentation

## 2015-02-12 DIAGNOSIS — S93402A Sprain of unspecified ligament of left ankle, initial encounter: Secondary | ICD-10-CM

## 2015-02-12 DIAGNOSIS — Y998 Other external cause status: Secondary | ICD-10-CM | POA: Insufficient documentation

## 2015-02-12 DIAGNOSIS — G894 Chronic pain syndrome: Secondary | ICD-10-CM | POA: Insufficient documentation

## 2015-02-12 DIAGNOSIS — Z8659 Personal history of other mental and behavioral disorders: Secondary | ICD-10-CM | POA: Insufficient documentation

## 2015-02-12 DIAGNOSIS — Z8739 Personal history of other diseases of the musculoskeletal system and connective tissue: Secondary | ICD-10-CM | POA: Insufficient documentation

## 2015-02-12 DIAGNOSIS — W010XXA Fall on same level from slipping, tripping and stumbling without subsequent striking against object, initial encounter: Secondary | ICD-10-CM | POA: Insufficient documentation

## 2015-02-12 DIAGNOSIS — Z8719 Personal history of other diseases of the digestive system: Secondary | ICD-10-CM | POA: Insufficient documentation

## 2015-02-12 DIAGNOSIS — Y9289 Other specified places as the place of occurrence of the external cause: Secondary | ICD-10-CM | POA: Insufficient documentation

## 2015-02-12 DIAGNOSIS — S9032XA Contusion of left foot, initial encounter: Secondary | ICD-10-CM

## 2015-02-12 DIAGNOSIS — Z720 Tobacco use: Secondary | ICD-10-CM | POA: Insufficient documentation

## 2015-02-12 MED ORDER — HYDROCODONE-ACETAMINOPHEN 5-325 MG PO TABS: ORAL_TABLET | ORAL | Status: DC

## 2015-02-12 MED ORDER — OXYCODONE-ACETAMINOPHEN 5-325 MG PO TABS: 1.0000 | ORAL_TABLET | ORAL | Status: DC | PRN

## 2015-02-12 MED ORDER — NAPROXEN 500 MG PO TABS: 500.0000 mg | ORAL_TABLET | Freq: Two times a day (BID) | ORAL | Status: DC

## 2015-02-12 NOTE — Discharge Instructions (Signed)
Ankle Sprain  An ankle sprain is an injury to the strong, fibrous tissues (ligaments) that hold your ankle bones together.   HOME CARE   · Put ice on your ankle for 1-2 days or as told by your doctor.  ¨ Put ice in a plastic bag.  ¨ Place a towel between your skin and the bag.  ¨ Leave the ice on for 15-20 minutes at a time, every 2 hours while you are awake.  · Only take medicine as told by your doctor.  · Raise (elevate) your injured ankle above the level of your heart as much as possible for 2-3 days.  · Use crutches if your doctor tells you to. Slowly put your own weight on the affected ankle. Use the crutches until you can walk without pain.  · If you have a plaster splint:  ¨ Do not rest it on anything harder than a pillow for 24 hours.  ¨ Do not put weight on it.  ¨ Do not get it wet.  ¨ Take it off to shower or bathe.  · If given, use an elastic wrap or support stocking for support. Take the wrap off if your toes lose feeling (numb), tingle, or turn cold or blue.  · If you have an air splint:  ¨ Add or let out air to make it comfortable.  ¨ Take it off at night and to shower and bathe.  ¨ Wiggle your toes and move your ankle up and down often while you are wearing it.  GET HELP IF:  · You have rapidly increasing bruising or puffiness (swelling).  · Your toes feel very cold.  · You lose feeling in your foot.  · Your medicine does not help your pain.  GET HELP RIGHT AWAY IF:   · Your toes lose feeling (numb) or turn blue.  · You have severe pain that is increasing.  MAKE SURE YOU:   · Understand these instructions.  · Will watch your condition.  · Will get help right away if you are not doing well or get worse.  Document Released: 11/26/2007 Document Revised: 10/24/2013 Document Reviewed: 12/22/2011  [employer]® Patient Information ©2015 [employer]. This information is not intended to replace advice given to you by your health care provider. Make sure you discuss any questions you have with your health care  provider.

## 2015-02-12 NOTE — ED Provider Notes (Signed)
CSN: [id number]     Arrival date & time 02/12/15  1333 History  This chart was scribed for non-physician practitioner, Kem Parkinson, PA-C, working with Virgel Manifold, MD by Terressa Koyanagi, ED Scribe. This patient was seen in room APFT23/APFT23 and the patient's care was started at 3:05 PM.   Chief Complaint  Patient presents with  . Ankle Pain   The history is provided by the patient. No language interpreter was used.   PCP: Glo Herring., MD HPI Comments: Danielle Vazquez is a 44 y.o. female, with PMHx noted below including DDD and chronic pain syndrome, who presents to the Emergency Department complaining of traumatic, sudden onset, 9/10, aching, constant, right ankle pain with associated swelling onset today after pt tripped on a broken part of the porch, fell and twisted her right ankle. Pt denies LOC or head trauma resulting from the fall. Pt reports icing the affected area at home without improvement. Pt denies right knee pain, right thigh pain numbness or weakness of the LE, abd pain, fecal or urinary incontinence, or any other Sx at this time.    Past Medical History  Diagnosis Date  . Anxiety   . GERD (gastroesophageal reflux disease)   . Chronic pain syndrome   . DDD (degenerative disc disease)     has had injections in past  . Degenerative disc disease   . Gastritis   . Diverticulosis   . Hiatal hernia    Past Surgical History  Procedure Laterality Date  . Cholecystectomy  2009  . Tubal ligation  1993  . Ileocolonoscopy  09/23/2011    OEU:MPNTIR, multiple in the rectum/Polyps, multiple in the distal transverse colon/Diverticulosis,mild,left sided diverticulosis/Internal hemorrhoids/path: tubular adenomas  . Esophagogastroduodenoscopy  April 2013    SLF: esophageal stricture, gastritis, hiatal hernia, negative H.pylori  . Colonoscopy with propofol N/A 02/01/2013    WER:XVQMGQ mucosa in the terminal ileum/Moderate diverticulosis in the sigmoid colon/ONE RECTAL POLYP  REMOVED/Small internal hemorrhoids  . Esophagogastroduodenoscopy (egd) with esophageal dilation N/A 02/01/2013    QPY:[id number] at the gastroesophageal junction/MILD Non-erosive gastritis & MODERATE DUODENITIS   Family History  Problem Relation Age of Onset  . Heart attack Mother   . Heart attack Father   . Colon cancer Neg Hx   . Anesthesia problems Neg Hx   . Hypotension Neg Hx   . Malignant hyperthermia Neg Hx   . Pseudochol deficiency Neg Hx    Social History  Substance Use Topics  . Smoking status: Current Every Day Smoker -- 0.50 packs/day for 25 years    Types: Cigarettes  . Smokeless tobacco: None  . Alcohol Use: No     Comment: rarely   OB History    No data available     Review of Systems  Constitutional: Negative for fever and chills.  Musculoskeletal: Positive for arthralgias (right ankle pain).  All other systems reviewed and are negative.  Allergies  Hydrocodone and Codeine  Home Medications   Prior to Admission medications   Medication Sig Start Date End Date Taking? Authorizing Provider  diazepam (VALIUM) 10 MG tablet Take 10 mg by mouth 4 (four) times daily as needed. 02/08/15  Yes Historical Provider, MD  ibuprofen (ADVIL,MOTRIN) 200 MG tablet Take 400 mg by mouth every 8 (eight) hours as needed. Fever/aches and pain   Yes Historical Provider, MD   Triage Vitals: BP 96/66 mmHg  Pulse 95  Temp(Src) 98 F (36.7 C) (Oral)  Resp 16  Ht 5\' 5"  (1.651 m)  Wt  143 lb (64.864 kg)  BMI 23.80 kg/m2  SpO2 100% Physical Exam  Constitutional: She is oriented to person, place, and time. She appears well-developed and well-nourished.  HENT:  Head: Normocephalic.  Eyes: EOM are normal.  Neck: Normal range of motion.  Pulmonary/Chest: Effort normal.  Abdominal: She exhibits no distension.  Musculoskeletal:       Left foot: There is decreased range of motion, tenderness and swelling (lateral left foot).  FROM of left knee, no obvious effusion. FROM of hip.    Neurological: She is alert and oriented to person, place, and time.  Psychiatric: She has a normal mood and affect.  Nursing note and vitals reviewed.   ED Course  Procedures (including critical care time) DIAGNOSTIC STUDIES: Oxygen Saturation is 100% on RA, nl by my interpretation.    COORDINATION OF CARE: 3:11 PM-Discussed treatment plan which includes additional imaging of the metatarsals with pt at bedside and pt agreed to plan.  4:00 PM: Recheck. Discussed imaging results with pt.  Imaging Review  Dg Ankle Complete Left  02/12/2015   CLINICAL DATA:  Tripped and fell.  Right ankle pain.  EXAM: LEFT ANKLE COMPLETE - 3+ VIEW  COMPARISON:  None.  FINDINGS: Negative for a fracture or dislocation. No significant soft tissue swelling. Alignment of the left ankle is normal. Small calcaneal spur along the plantar aspect.  IMPRESSION: No acute bone abnormality.   Electronically Signed   By: Markus Daft M.D.   On: 02/12/2015 14:18   Dg Foot Complete Left  02/12/2015   CLINICAL DATA:  Acute left foot pain after fall on porch steps. Initial encounter.  EXAM: LEFT FOOT - COMPLETE 3+ VIEW  COMPARISON:  None.  FINDINGS: There is no evidence of fracture or dislocation. There is no evidence of arthropathy or other focal bone abnormality. Soft tissues are unremarkable.  IMPRESSION: No significant abnormality seen in the left foot.   Electronically Signed   By: Marijo Conception, M.D.   On: 02/12/2015 15:37      MDM   Final diagnoses:  Sprain of ankle, left, initial encounter  Foot contusion, left, initial encounter   Pt well appearing, ambulates with steady gait.  XRs reviewed and discussed with patient.    ASO applied.  Pain improved, remains NV intact.  Agrees to ortho f/u in one week if needed.    I personally performed the services described in this documentation, which was scribed in my presence. The recorded information has been reviewed and is accurate.    Kem Parkinson, PA-C 02/13/15  2346  Virgel Manifold, MD 02/14/15 1346

## 2015-02-12 NOTE — ED Notes (Signed)
Pt states that she tripped on a broken part of porch steps and fell.  C/o right ankle pain.

## 2016-07-01 ENCOUNTER — Encounter (HOSPITAL_COMMUNITY): Payer: Self-pay | Admitting: Emergency Medicine

## 2016-07-01 ENCOUNTER — Emergency Department (HOSPITAL_COMMUNITY)
Admission: EM | Admit: 2016-07-01 | Discharge: 2016-07-01 | Disposition: A | Discharge status: Home / Self Care | Payer: Self-pay | Attending: Emergency Medicine | Admitting: Emergency Medicine

## 2016-07-01 DIAGNOSIS — F17210 Nicotine dependence, cigarettes, uncomplicated: Secondary | ICD-10-CM | POA: Insufficient documentation

## 2016-07-01 DIAGNOSIS — K029 Dental caries, unspecified: Secondary | ICD-10-CM | POA: Insufficient documentation

## 2016-07-01 DIAGNOSIS — Z791 Long term (current) use of non-steroidal anti-inflammatories (NSAID): Secondary | ICD-10-CM | POA: Insufficient documentation

## 2016-07-01 DIAGNOSIS — K047 Periapical abscess without sinus: Secondary | ICD-10-CM | POA: Insufficient documentation

## 2016-07-01 MED ORDER — IBUPROFEN 400 MG PO TABS
600.0000 mg | ORAL_TABLET | Freq: Once | ORAL | Status: AC
  Administered 2016-07-01: 600 mg via ORAL
  Filled 2016-07-01: qty 2

## 2016-07-01 MED ORDER — PENICILLIN V POTASSIUM 500 MG PO TABS: 500.0000 mg | ORAL_TABLET | Freq: Four times a day (QID) | ORAL | 0 refills | Status: AC

## 2016-07-01 MED ORDER — ACETAMINOPHEN 325 MG PO TABS
650.0000 mg | ORAL_TABLET | Freq: Once | ORAL | Status: AC
  Administered 2016-07-01: 650 mg via ORAL
  Filled 2016-07-01: qty 2

## 2016-07-01 MED ORDER — PENICILLIN V POTASSIUM 250 MG PO TABS
500.0000 mg | ORAL_TABLET | Freq: Once | ORAL | Status: AC
  Administered 2016-07-01: 500 mg via ORAL
  Filled 2016-07-01: qty 2

## 2016-07-01 NOTE — ED Provider Notes (Signed)
[facility] DEPT Provider Note   CSN: [id number] Arrival date & time: 07/01/16  0308  Time seen 04:20 AM   History   Chief Complaint Chief Complaint  Patient presents with  . Dental Pain    HPI Danielle Vazquez is a 46 y.o. female.  HPI  patient states her right upper tooth broke off an undetermined period of time ago. She states it started hurting about 6:30 PM this evening. She states she had her temperature checked at work tonight prior to coming to the ED and it was 101. She states chewing, hot and cold liquids or food and air hitting her chest makes it hurt. No better but she's not tried anything. She has no difficulty swallowing or difficulty breathing. She feels like she has some swelling on the right side of her face. States she doesn't have a Pharmacist, community.  PCP Glo Herring., MD   Past Medical History:  Diagnosis Date  . Anxiety   . Chronic pain syndrome   . DDD (degenerative disc disease)    has had injections in past  . Degenerative disc disease   . Diverticulosis   . Gastritis   . GERD (gastroesophageal reflux disease)   . Hiatal hernia     Patient Active Problem List   Diagnosis Date Noted  . Loose stools 01/13/2013  . GERD (gastroesophageal reflux disease) 09/03/2011  . Helicobacter pylori ab+ 07/22/2011  . Hematochezia 07/22/2011  . Abdominal pain, other specified site 07/22/2011    Past Surgical History:  Procedure Laterality Date  . CHOLECYSTECTOMY  2009  . COLONOSCOPY WITH PROPOFOL N/A 02/01/2013   [id number] mucosa in the terminal ileum/Moderate diverticulosis in the sigmoid colon/ONE RECTAL POLYP REMOVED/Small internal hemorrhoids  . ESOPHAGOGASTRODUODENOSCOPY  April 2013   SLF: esophageal stricture, gastritis, hiatal hernia, negative H.pylori  . ESOPHAGOGASTRODUODENOSCOPY (EGD) WITH ESOPHAGEAL DILATION N/A 02/01/2013   [id number] at the gastroesophageal junction/MILD Non-erosive gastritis & MODERATE DUODENITIS  . ILEOColonoscopy  09/23/2011     [id number], multiple in the rectum/Polyps, multiple in the distal transverse colon/Diverticulosis,mild,left sided diverticulosis/Internal hemorrhoids/path: tubular adenomas  . TUBAL LIGATION  1993    OB History    No data available       Home Medications    Prior to Admission medications   Medication Sig Start Date End Date Taking? Authorizing Provider  naproxen (NAPROSYN) 500 MG tablet Take 1 tablet (500 mg total) by mouth 2 (two) times daily with a meal. 02/12/15  Yes Tammy Triplett, PA-C  diazepam (VALIUM) 10 MG tablet Take 10 mg by mouth 4 (four) times daily as needed. 02/08/15   Historical Provider, MD  ibuprofen (ADVIL,MOTRIN) 200 MG tablet Take 400 mg by mouth every 8 (eight) hours as needed. Fever/aches and pain    Historical Provider, MD  oxyCODONE-acetaminophen (PERCOCET/ROXICET) 5-325 MG per tablet Take 1 tablet by mouth every 4 (four) hours as needed. 02/12/15   Tammy Triplett, PA-C  penicillin v potassium (VEETID) 500 MG tablet Take 1 tablet (500 mg total) by mouth 4 (four) times daily. 07/01/16 07/08/16  Rolland Porter, MD    Family History Family History  Problem Relation Age of Onset  . Heart attack Mother   . Heart attack Father   . Colon cancer Neg Hx   . Anesthesia problems Neg Hx   . Hypotension Neg Hx   . Malignant hyperthermia Neg Hx   . Pseudochol deficiency Neg Hx     Social History Social History  Substance Use Topics  . Smoking status: Current Every  Day Smoker    Packs/day: 0.50    Years: 25.00    Types: Cigarettes  . Smokeless tobacco: Never Used  . Alcohol use No     Comment: rarely  employed Smokes 1/2 -1 ppd  Allergies   Hydrocodone and Codeine   Review of Systems Review of Systems  All other systems reviewed and are negative.    Physical Exam Updated Vital Signs BP 115/83 (BP Location: Left Arm)   Pulse 82   Temp 97.6 F (36.4 C) (Oral)   Resp 20   Ht 5\' 6"  (1.676 m)   Wt 175 lb (79.4 kg)   LMP 06/24/2016   SpO2 97%   BMI 28.25  kg/m   Vital signs normal    Physical Exam  Constitutional: She is oriented to person, place, and time. She appears well-developed and well-nourished. She appears distressed.  HENT:  Head: Normocephalic and atraumatic.  Right Ear: External ear normal.  Left Ear: External ear normal.  Nose: Nose normal.  Patient has her lower teeth mainly the middle section with loss of all her molars, a few are broken to the gumline. She has a lot of tartar on these teeth. All of her teeth in her upper gums are broken off at the gumline. She appears to have pain around either the canine and first molar on the right upper was some increased redness and swelling of the gum. I do not see any obvious swelling of her face.  Eyes: Conjunctivae and EOM are normal. Pupils are equal, round, and reactive to light.  Neck: Normal range of motion. Neck supple.  Cardiovascular: Normal rate.   Pulmonary/Chest: Effort normal. No respiratory distress.  Musculoskeletal: Normal range of motion.  Neurological: She is alert and oriented to person, place, and time. No cranial nerve deficit.  Skin: Skin is warm and dry. No erythema.  Psychiatric: She has a normal mood and affect. Her behavior is normal. Thought content normal.     ED Treatments / Results   Procedures Procedures (including critical care time)  Medications Ordered in ED Medications  ibuprofen (ADVIL,MOTRIN) tablet 600 mg (not administered)  acetaminophen (TYLENOL) tablet 650 mg (not administered)  penicillin v potassium (VEETID) tablet 500 mg (not administered)     Initial Impression / Assessment and Plan / ED Course  I have reviewed the triage vital signs and the nursing notes.  Pertinent labs & imaging results that were available during my care of the patient were reviewed by me and considered in my medical decision making (see chart for details).  Clinical Course    We discussed she would probably need to see an oral surgeon to have these  teeth removed. She states she wants to have the marked extracted. Patient was started on oral antibiotics and given ibuprofen and acetaminophen for pain.  Final Clinical Impressions(s) / ED Diagnoses   Final diagnoses:  Pain due to dental caries  Dental abscess    New Prescriptions New Prescriptions   PENICILLIN V POTASSIUM (VEETID) 500 MG TABLET    Take 1 tablet (500 mg total) by mouth 4 (four) times daily.  OTC ibuprofen and acetaminophen  Plan discharge  Rolland Porter, MD, Barbette Or, MD 07/01/16 [phone]

## 2016-07-01 NOTE — Discharge Instructions (Signed)
You can take ibuprofen 600 mg + acetaminophen 1000 mg 4 times a day for pain. Take the Penicillin until gone. You need to find an oral surgeon to remove your teeth. Return to the ED if you get a high fever, a lot of swelling in your throat, or have difficulty breathing or swallowing.

## 2016-07-01 NOTE — ED Triage Notes (Signed)
Patient has dental caries in upper right side of mouth causing pain, starting last night.

## 2016-08-21 ENCOUNTER — Encounter: Payer: Self-pay | Admitting: Gastroenterology

## 2016-09-30 ENCOUNTER — Encounter (HOSPITAL_COMMUNITY): Payer: Self-pay | Admitting: *Deleted

## 2016-09-30 ENCOUNTER — Emergency Department (HOSPITAL_COMMUNITY)
Admission: EM | Admit: 2016-09-30 | Discharge: 2016-10-01 | Disposition: A | Discharge status: Home / Self Care | Payer: Self-pay | Attending: Emergency Medicine | Admitting: Emergency Medicine

## 2016-09-30 DIAGNOSIS — T50901A Poisoning by unspecified drugs, medicaments and biological substances, accidental (unintentional), initial encounter: Secondary | ICD-10-CM

## 2016-09-30 DIAGNOSIS — F17210 Nicotine dependence, cigarettes, uncomplicated: Secondary | ICD-10-CM | POA: Insufficient documentation

## 2016-09-30 DIAGNOSIS — T402X1A Poisoning by other opioids, accidental (unintentional), initial encounter: Secondary | ICD-10-CM | POA: Insufficient documentation

## 2016-09-30 LAB — CBC WITH DIFFERENTIAL/PLATELET
Basophils Absolute: 0 10*3/uL (ref 0.0–0.1)
Basophils Relative: 0 %
Eosinophils Absolute: 0.3 10*3/uL (ref 0.0–0.7)
Eosinophils Relative: 3 %
HEMATOCRIT: 41.3 % (ref 36.0–46.0)
HEMOGLOBIN: 13.7 g/dL (ref 12.0–15.0)
LYMPHS ABS: 1.5 10*3/uL (ref 0.7–4.0)
Lymphocytes Relative: 14 %
MCH: 30.5 pg (ref 26.0–34.0)
MCHC: 33.2 g/dL (ref 30.0–36.0)
MCV: 92 fL (ref 78.0–100.0)
Monocytes Absolute: 0.6 10*3/uL (ref 0.1–1.0)
Monocytes Relative: 6 %
NEUTROS ABS: 8.6 10*3/uL — AB (ref 1.7–7.7)
NEUTROS PCT: 77 %
Platelets: 223 10*3/uL (ref 150–400)
RBC: 4.49 MIL/uL (ref 3.87–5.11)
RDW: 13.7 % (ref 11.5–15.5)
WBC: 11.1 10*3/uL — ABNORMAL HIGH (ref 4.0–10.5)

## 2016-09-30 LAB — COMPREHENSIVE METABOLIC PANEL
ALBUMIN: 3.5 g/dL (ref 3.5–5.0)
ALK PHOS: 74 U/L (ref 38–126)
ALT: 19 U/L (ref 14–54)
ANION GAP: 6 (ref 5–15)
AST: 22 U/L (ref 15–41)
BUN: 11 mg/dL (ref 6–20)
CHLORIDE: 106 mmol/L (ref 101–111)
CO2: 24 mmol/L (ref 22–32)
Calcium: 8.4 mg/dL — ABNORMAL LOW (ref 8.9–10.3)
Creatinine, Ser: 0.83 mg/dL (ref 0.44–1.00)
GFR calc Af Amer: >60 mL/min (ref >60)
GFR calc non Af Amer: >60 mL/min (ref >60)
GLUCOSE: 120 mg/dL — AB (ref 65–99)
POTASSIUM: 3.5 mmol/L (ref 3.5–5.1)
SODIUM: 136 mmol/L (ref 135–145)
Total Bilirubin: 0.3 mg/dL (ref 0.3–1.2)
Total Protein: 6.2 g/dL — ABNORMAL LOW (ref 6.5–8.1)

## 2016-09-30 LAB — ETHANOL: Alcohol, Ethyl (B): <5 mg/dL (ref <5)

## 2016-09-30 LAB — SALICYLATE LEVEL

## 2016-09-30 LAB — ACETAMINOPHEN LEVEL: Acetaminophen (Tylenol), Serum: <10 ug/mL — ABNORMAL LOW (ref 10–30)

## 2016-09-30 MED ORDER — SODIUM CHLORIDE 0.9 % IV BOLUS (SEPSIS): 500.0000 mL | Freq: Once | INTRAVENOUS | Status: DC

## 2016-09-30 MED ORDER — ACETAMINOPHEN 325 MG PO TABS
650.0000 mg | ORAL_TABLET | Freq: Once | ORAL | Status: AC
  Administered 2016-10-01: 650 mg via ORAL
  Filled 2016-09-30: qty 2

## 2016-09-30 MED ORDER — NALOXONE HCL 2 MG/2ML IJ SOSY: PREFILLED_SYRINGE | INTRAMUSCULAR | 0 refills | Status: DC

## 2016-09-30 NOTE — ED Provider Notes (Signed)
Emergency Department Provider Note   I have reviewed the triage vital signs and the nursing notes.  By signing my name below, I, Macon Large, attest that this documentation has been prepared under the direction and in the presence of Margette Fast, MD. Electronically Signed: Macon Large, ED Scribe. 09/30/16. 10:35 PM.  HISTORY  Chief Complaint Drug Overdose   HPI Danielle Vazquez is a 46 y.o. female with PMHx of anxiety brought in by ambulance who presents to the Emergency Department presenting with drug overdose that occurred PTA. Per EMS note, pt was found with "agonal breathing" and treated with narcan, 4mg , intranasally on route to ED with significant relief. Per pt, she reports taking an unprescribed oxycotin for a tooth ache and back pain that was brought to her by a friend. She notes "passing out" on a couch before being awakened by EMS. Per pt, she reports taking oxycotin in the past and other pain medications for pain.   After further questioning she states "I think it might have been heroin and I remember snorting it". Pt notes this was her first occurrence using heroin. Pt has no pain at this time. She denies alcohol consumption and other illicit drug use. Pt also denies SI/HI.   Past Medical History:  Diagnosis Date  . Anxiety   . Chronic pain syndrome   . DDD (degenerative disc disease)    has had injections in past  . Degenerative disc disease   . Diverticulosis   . Gastritis   . GERD (gastroesophageal reflux disease)   . Hiatal hernia     Patient Active Problem List   Diagnosis Date Noted  . Loose stools 01/13/2013  . GERD (gastroesophageal reflux disease) 09/03/2011  . Helicobacter pylori ab+ 07/22/2011  . Hematochezia 07/22/2011  . Abdominal pain, other specified site 07/22/2011    Past Surgical History:  Procedure Laterality Date  . CHOLECYSTECTOMY  2009  . COLONOSCOPY WITH PROPOFOL N/A 02/01/2013   [id number] mucosa in the terminal  ileum/Moderate diverticulosis in the sigmoid colon/ONE RECTAL POLYP REMOVED/Small internal hemorrhoids  . ESOPHAGOGASTRODUODENOSCOPY  April 2013   SLF: esophageal stricture, gastritis, hiatal hernia, negative H.pylori  . ESOPHAGOGASTRODUODENOSCOPY (EGD) WITH ESOPHAGEAL DILATION N/A 02/01/2013   [id number] at the gastroesophageal junction/MILD Non-erosive gastritis & MODERATE DUODENITIS  . ILEOColonoscopy  09/23/2011   [id number], multiple in the rectum/Polyps, multiple in the distal transverse colon/Diverticulosis,mild,left sided diverticulosis/Internal hemorrhoids/path: tubular adenomas  . TUBAL LIGATION  1993      Allergies Hydrocodone and Codeine  Family History  Problem Relation Age of Onset  . Heart attack Mother   . Heart attack Father   . Colon cancer Neg Hx   . Anesthesia problems Neg Hx   . Hypotension Neg Hx   . Malignant hyperthermia Neg Hx   . Pseudochol deficiency Neg Hx     Social History Social History  Substance Use Topics  . Smoking status: Current Every Day Smoker    Packs/day: 0.50    Years: 25.00    Types: Cigarettes  . Smokeless tobacco: Never Used  . Alcohol use No     Comment: rarely    Review of Systems  Constitutional: No fever/chills Eyes: No visual changes. ENT: No sore throat. Cardiovascular: Denies chest pain. Respiratory: Denies shortness of breath. Gastrointestinal: No abdominal pain.  No nausea, no vomiting.  No diarrhea.  No constipation. Genitourinary: Negative for dysuria. Musculoskeletal: Positive for chronic back pain. Skin: Negative for rash. Neurological: Negative for headaches, focal weakness or numbness.  10-point ROS otherwise negative.  ____________________________________________   PHYSICAL EXAM:  VITAL SIGNS: ED Triage Vitals  Enc Vitals Group     BP 09/30/16 2211 132/79     Pulse Rate 09/30/16 2211 78     Resp 09/30/16 2211 15     Temp 09/30/16 2211 97.9 F (36.6 C)     Temp Source 09/30/16 2211 Oral      SpO2 09/30/16 2211 98 %     Weight 09/30/16 2205 185 lb (83.9 kg)     Height 09/30/16 2205 5\' 5"  (1.651 m)   Constitutional: Alert and oriented. Well appearing and in no acute distress. Eyes: Conjunctivae are normal.  Head: Atraumatic. Nose: No congestion/rhinnorhea. Mouth/Throat: Mucous membranes are moist.  Oropharynx non-erythematous. Neck: No stridor Cardiovascular: Normal rate, regular rhythm. Good peripheral circulation. Grossly normal heart sounds.   Respiratory: Normal respiratory effort.  No retractions. Lungs CTAB. Gastrointestinal: Soft and nontender. No distention.  Musculoskeletal: No lower extremity tenderness nor edema. No gross deformities of extremities. Neurologic:  Normal speech and language. No gross focal neurologic deficits are appreciated.  Skin:  Skin is warm, dry and intact. No rash noted.  ____________________________________________   LABS (all labs ordered are listed, but only abnormal results are displayed)  Labs Reviewed  COMPREHENSIVE METABOLIC PANEL - Abnormal; Notable for the following:       Result Value   Glucose, Bld 120 (*)    Calcium 8.4 (*)    Total Protein 6.2 (*)    All other components within normal limits  CBC WITH DIFFERENTIAL/PLATELET - Abnormal; Notable for the following:    WBC 11.1 (*)    Neutro Abs 8.6 (*)    All other components within normal limits  ACETAMINOPHEN LEVEL - Abnormal; Notable for the following:    Acetaminophen (Tylenol), Serum <10 (*)    All other components within normal limits  ETHANOL  SALICYLATE LEVEL   ____________________________________________  EKG   EKG Interpretation  Date/Time:  Tuesday September 30 2016 22:08:15 EDT Ventricular Rate:  78 PR Interval:    QRS Duration: 112 QT Interval:  383 QTC Calculation: 437 R Axis:   76 Text Interpretation:  Sinus rhythm Incomplete right bundle branch block No STEMI.  Confirmed by LONG MD, JOSHUA [phone]) on 09/30/2016 10:17:06 PM       ____________________________________________   PROCEDURES  Procedure(s) performed:   Procedures  None ____________________________________________   INITIAL IMPRESSION / ASSESSMENT AND PLAN / ED COURSE  Pertinent labs & imaging results that were available during my care of the patient were reviewed by me and considered in my medical decision making (see chart for details).  Patient resents to the emergency department for evaluation after unintentional overdose when snorting heroin. He was given Narcan in the field and is awake, alert, and conversational during my exam. No drowsiness or hypoxemia. Patient endorses this is the first time she used heroin. Denies any long-acting opiate use. Denies using/abusing any other drugs or alcohol. Plan for ED observation and baseline labs. Patient adamantly denies that this was a suicide attempt.   11:43 PM Patient's daughter at bedside. Patient remains awake and alert. No desaturation on monitor. Ambulatory to the restroom without difficulty. Daughter is here and can drive the patient home. No history to suggest intentional self-harm behavior or use of long-acting opiates after she admitted to using heroine. Patient has been observed for 2 hours in the ED. Will discharge home with sober family member and Narcan Rx. Discussed return precautions in detail.  At this time, I do not feel there is any life-threatening condition present. I have reviewed and discussed all results (EKG, imaging, lab, urine as appropriate), exam findings with patient. I have reviewed nursing notes and appropriate previous records.  I feel the patient is safe to be discharged home without further emergent workup. Discussed usual and customary return precautions. Patient and family (if present) verbalize understanding and are comfortable with this plan.  Patient will follow-up with their primary care provider. If they do not have a primary care provider, information for follow-up  has been provided to them. All questions have been answered.  ____________________________________________  FINAL CLINICAL IMPRESSION(S) / ED DIAGNOSES  Final diagnoses:  Accidental drug overdose, initial encounter     MEDICATIONS GIVEN DURING THIS VISIT:  Medications  sodium chloride 0.9 % bolus 500 mL (not administered)  acetaminophen (TYLENOL) tablet 650 mg (not administered)     NEW OUTPATIENT MEDICATIONS STARTED DURING THIS VISIT:  New Prescriptions   NALOXONE (NARCAN) 2 MG/2ML INJECTION    Inject with concern for opiate overdose      Note:  This document was prepared using Dragon voice recognition software and may include unintentional dictation errors.  Nanda Quinton, MD Emergency Medicine  I personally performed the services described in this documentation, which was scribed in my presence. The recorded information has been reviewed and is accurate.       Margette Fast, MD 09/30/16 [phone]

## 2016-09-30 NOTE — Discharge Instructions (Signed)
Substance Abuse Treatment Programs ° °Intensive Outpatient Programs °[facility]     °[address]      °[place], [place]                   °[phone]      ° °[facility] °[address] °[place], [place] °[phone] ° °[facility]     °(Inpatient and outpatient)     °[address]           °[phone]   ° °[facility] °[phone] (Suboxone and Methadone) ° °[address]      °[place], [place] [address]      °[phone]      ° °[address] °[place], [place] °[phone] ° °[facility] (Outpatient/Inpatient, [facility])    °(insurance only) [phone]      °       °[facility] (Groups & Residential) °[place], [place] °[phone] ° °   °[facility]     °[address]     °[place], [place]      °[phone]      ° °[facility] (for caregivers and family) °[address] °[place], [place] °336-299-4655 ° ° ° ° ° °Residential Treatment Programs °[facility]      °[address], [place], [place] [address]  °[phone]      ° °[facility] °[address] [place], [place] [address] °[phone] ° °[facility]        °[phone]      ° °[facility] °[phone] ° °[facility]             °[address]                                         °[place], [place]                                                °[phone] or [phone]                              ° °[facility] [place] °[address] °[place], [address] °[phone] ° °[facility]    °[address]      °, [place]     °[phone]      ° °[facility] °[address] °, [place] °[phone] ° °[facility]   °[address]     °[place], [place] [address]     °[phone]      °Admissions: 8am-3pm M-F ° °[facility] °[address] °[place],  [place] °336-227-7417 ° °[facility]: Residential Program (90 Days)   °[place], [place]      °336-725-8389 or [phone]    ° °[facility] °[place], [place] °(Walk in Hours over the weekend or by referral) ° °[facility] °[address], [place],  [address] °([phone] ° °Crisis Mobile: [facility]:  1-877-626-1772 (for crisis response 24 hours a day) °[facility] Hotline:      1-800-256-2452 °Outpatient Psychiatry and Counseling ° °[facility]: Mobile Crisis   Management 24 hours:  1-877-626-1772 ° °[facility] sliding scale fee and walk in schedule: M-F 8am-12pm/1pm-3pm °[address]  °[place], [place] [address] °[phone] ° °[facility] °[address] °[place], [place] [address] °[phone] ° °[facility] ([facility])- new patient walk-in appointments available Monday - Friday 8am -3pm.          °[address] °[place], [place] [address] °336-676-6840 or crisis line- 336-676-6905 ° °[facility] Intensive Outpatient Therapy Program °[address] °[place], [place] [address] °336-832-9804 ° °[facility]                  °Crisis Services      °[phone]      °[address]     °[place], [place] [address]                ° °[facility]   °[facility] °[phone] °[address] °[place], [place] [address] ° ° °[facility]          °[address]  °[place], [place] [address]       °[phone] ° °[facility] °[address] °[place], [place] [address] °336-292-1510 ° °[facility]    °[address]    °[place], [place] [address]     °[phone]      ° °Parish McKinney, MD     °[address]     °[place] [address]     °[phone]     °  °[facility] °[address] °[place] [address] ° °[facility]     °[address]     °[place], [place]      °336-542-2076      ° °[facility] °Shamsher Ahluwalia, MD °[address] °[place], [place] [address] °[phone] ° °[facility]     °[address]     °[place], [place] [address]     °336-274-1237      ° °[facility] °[address] °[place], [place] [address] °336-854-4450 °Resources for Temporary Residential Assistance/Crisis Centers ° °DAY CENTERS °[facility] °M-F 8am-3pm   °[address] [place], [place] [address]   336-332-0824 °Services include: laundry, barbering, support groups, case management, phone  & computer access, showers, AA/NA mtgs, mental health/substance abuse nurse, job skills class, disability information, VA assistance, spiritual classes, etc.  ° °HOMELESS SHELTERS ° °[facility]     °[facility]   °[address], [place]     °[phone]       °       °[facility] (women and children)       °[address] °[place], [place] [address] °336-275-0820 °[email or web address] for application and process °Application Required ° °[facility]   °[address]    °[place] [address]     °[phone]       °             °[facility] °[address] °[place], [place] [address] °[phone] °336-235-0363(schedule application appt.) °Application Required ° °[facility] (women only)    °[address]     °[place],  [address]     °336-884-1039      °  Intake starts 6pm daily °Need valid ID, SSC, & Police report °[facility] [place] °[address] °[place], [place] °336-881-5420 °Application Required ° °[facility] (men only)     °[address]      °[place], [place]     °[phone]      ° °[facility] °(Pregnant women only) °[address] °, [place] °336-275-0206 ° °[facility]      °[address]      °[place], [place] [address]     °[phone]      °       °[facility] °[address] °[place], [place] °336-723-1848 °90 day commitment/SA/Application process ° °[facility](men only)     °[address]     °[place], [place]     °[phone]       °Check-in at 7pm     °       °[facility] °[address] °[place], [place] [address] °336-248-6684 °Men/Women/Women and Children must be there by 7 pm ° °[facility] °[place], [place] °336-722-8721                ° °

## 2016-09-30 NOTE — ED Triage Notes (Signed)
Ems called out for unresponsive; first responders arrived and found pt with agonal breathing, pt was given narcan 4mg  intranasally and first responders assisted pt with bag mask; upon arrival of ems they found pt alert and oriented; pt states she took an unknown amount of oxycontin; pt is not sure of amount and how many she took; pt states the pills are not prescribed to her

## 2016-10-01 MED ORDER — ONDANSETRON 4 MG PO TBDP
4.0000 mg | ORAL_TABLET | Freq: Once | ORAL | Status: AC
  Administered 2016-10-01: 4 mg via ORAL
  Filled 2016-10-01: qty 1

## 2017-05-12 ENCOUNTER — Other Ambulatory Visit: Payer: Self-pay

## 2017-05-12 ENCOUNTER — Encounter (HOSPITAL_COMMUNITY): Payer: Self-pay | Admitting: *Deleted

## 2017-05-12 ENCOUNTER — Emergency Department (HOSPITAL_COMMUNITY): Payer: BLUE CROSS/BLUE SHIELD

## 2017-05-12 ENCOUNTER — Emergency Department (HOSPITAL_COMMUNITY)
Admission: EM | Admit: 2017-05-12 | Discharge: 2017-05-12 | Disposition: A | Discharge status: Home / Self Care | Payer: BLUE CROSS/BLUE SHIELD | Attending: Emergency Medicine | Admitting: Emergency Medicine

## 2017-05-12 DIAGNOSIS — Z79899 Other long term (current) drug therapy: Secondary | ICD-10-CM | POA: Insufficient documentation

## 2017-05-12 DIAGNOSIS — R42 Dizziness and giddiness: Secondary | ICD-10-CM

## 2017-05-12 DIAGNOSIS — R110 Nausea: Secondary | ICD-10-CM | POA: Insufficient documentation

## 2017-05-12 DIAGNOSIS — R531 Weakness: Secondary | ICD-10-CM | POA: Diagnosis not present

## 2017-05-12 DIAGNOSIS — F17210 Nicotine dependence, cigarettes, uncomplicated: Secondary | ICD-10-CM | POA: Diagnosis not present

## 2017-05-12 DIAGNOSIS — R51 Headache: Secondary | ICD-10-CM | POA: Insufficient documentation

## 2017-05-12 DIAGNOSIS — Z885 Allergy status to narcotic agent status: Secondary | ICD-10-CM | POA: Diagnosis not present

## 2017-05-12 LAB — CBC WITH DIFFERENTIAL/PLATELET
Basophils Absolute: 0 10*3/uL (ref 0.0–0.1)
Basophils Relative: 0 %
EOS ABS: 0.1 10*3/uL (ref 0.0–0.7)
EOS PCT: 1 %
HCT: 45.5 % (ref 36.0–46.0)
Hemoglobin: 14.8 g/dL (ref 12.0–15.0)
LYMPHS ABS: 1.9 10*3/uL (ref 0.7–4.0)
Lymphocytes Relative: 18 %
MCH: 30.2 pg (ref 26.0–34.0)
MCHC: 32.5 g/dL (ref 30.0–36.0)
MCV: 92.9 fL (ref 78.0–100.0)
MONOS PCT: 7 %
Monocytes Absolute: 0.8 10*3/uL (ref 0.1–1.0)
Neutro Abs: 8.2 10*3/uL — ABNORMAL HIGH (ref 1.7–7.7)
Neutrophils Relative %: 74 %
PLATELETS: 298 10*3/uL (ref 150–400)
RBC: 4.9 MIL/uL (ref 3.87–5.11)
RDW: 14.1 % (ref 11.5–15.5)
WBC: 11 10*3/uL — AB (ref 4.0–10.5)

## 2017-05-12 LAB — BASIC METABOLIC PANEL
Anion gap: 8 (ref 5–15)
BUN: 7 mg/dL (ref 6–20)
CO2: 23 mmol/L (ref 22–32)
CREATININE: 0.79 mg/dL (ref 0.44–1.00)
Calcium: 9.1 mg/dL (ref 8.9–10.3)
Chloride: 108 mmol/L (ref 101–111)
GFR calc Af Amer: >60 mL/min (ref >60)
GFR calc non Af Amer: >60 mL/min (ref >60)
Glucose, Bld: 109 mg/dL — ABNORMAL HIGH (ref 65–99)
Potassium: 3.3 mmol/L — ABNORMAL LOW (ref 3.5–5.1)
SODIUM: 139 mmol/L (ref 135–145)

## 2017-05-12 MED ORDER — KETOROLAC TROMETHAMINE 30 MG/ML IJ SOLN
30.0000 mg | Freq: Once | INTRAMUSCULAR | Status: AC
  Administered 2017-05-12: 30 mg via INTRAVENOUS
  Filled 2017-05-12: qty 1

## 2017-05-12 MED ORDER — SODIUM CHLORIDE 0.9 % IV BOLUS (SEPSIS)
1000.0000 mL | Freq: Once | INTRAVENOUS | Status: AC
  Administered 2017-05-12: 1000 mL via INTRAVENOUS

## 2017-05-12 MED ORDER — DIPHENHYDRAMINE HCL 50 MG/ML IJ SOLN
25.0000 mg | Freq: Once | INTRAMUSCULAR | Status: AC
  Administered 2017-05-12: 25 mg via INTRAVENOUS
  Filled 2017-05-12: qty 1

## 2017-05-12 MED ORDER — MECLIZINE HCL 25 MG PO TABS: 25.0000 mg | ORAL_TABLET | Freq: Three times a day (TID) | ORAL | 0 refills | Status: DC | PRN

## 2017-05-12 MED ORDER — METOCLOPRAMIDE HCL 5 MG/ML IJ SOLN
10.0000 mg | Freq: Once | INTRAMUSCULAR | Status: AC
  Administered 2017-05-12: 10 mg via INTRAVENOUS
  Filled 2017-05-12: qty 2

## 2017-05-12 MED ORDER — ONDANSETRON 4 MG PO TBDP: 4.0000 mg | ORAL_TABLET | Freq: Three times a day (TID) | ORAL | 0 refills | Status: DC | PRN

## 2017-05-12 MED ORDER — MECLIZINE HCL 12.5 MG PO TABS
25.0000 mg | ORAL_TABLET | Freq: Once | ORAL | Status: AC
  Administered 2017-05-12: 25 mg via ORAL
  Filled 2017-05-12: qty 2

## 2017-05-12 NOTE — ED Notes (Signed)
Pt too sleepy to ambulate at this time, edp notified.

## 2017-05-12 NOTE — ED Triage Notes (Signed)
Pt c/o dizziness and "feeling funny" while at work x 1 hour ago; pt states she started to see black spots;  Pt c/o headache

## 2017-05-12 NOTE — ED Notes (Signed)
Pt is still asleep at this time.

## 2017-05-12 NOTE — Discharge Instructions (Signed)
Your testing is reassuring.  Take the dizziness medication as prescribed.  It may make you drowsy so do not take it if you are working or driving.  Follow-up with your doctor as well as the neurologist.  Return to the ED if you develop new or worsening symptoms.

## 2017-05-12 NOTE — ED Notes (Signed)
Pt still asleep edp notified.

## 2017-05-12 NOTE — ED Notes (Signed)
Pt ambulated around nurses station. Pt states that she is only a tiny bit dizzy but feels much better than she did previously. Dr. Wyvonnia Dusky aware.

## 2017-05-12 NOTE — ED Provider Notes (Signed)
[facility] EMERGENCY DEPARTMENT Provider Note   CSN: [id number] Arrival date & time: 05/12/17  0103     History   Chief Complaint Chief Complaint  Patient presents with  . Dizziness    HPI Danielle Vazquez is a 46 y.o. female.  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  She describes room spinning dizziness that is worse when she tries to stand up.  This is associated with diffuse headache that was rather sudden in onset.  Associated with photophobia and nausea.  No vomiting.  No focal weakness, numbness or tingling.  No difficulty speaking or difficulty swallowing.  Notably patient states she has not had anything to eat more than 24 hours went to work tonight without eating.  No vomiting or diarrhea.  No chest pain or shortness of breath.  She denies any history of headaches or history of dizzy spells in the past.  Denies any blurry vision or double vision.  Patient feels like she is going to pass out and starts to see black spots when she stands up.   The history is provided by the patient and the EMS personnel.  Dizziness  Associated symptoms: headaches, nausea and weakness   Associated symptoms: no chest pain, no shortness of breath and no vomiting     Past Medical History:  Diagnosis Date  . Anxiety   . Chronic pain syndrome   . DDD (degenerative disc disease)    has had injections in past  . Degenerative disc disease   . Diverticulosis   . Gastritis   . GERD (gastroesophageal reflux disease)   . Hiatal hernia     Patient Active Problem List   Diagnosis Date Noted  . Loose stools 01/13/2013  . GERD (gastroesophageal reflux disease) 09/03/2011  . Helicobacter pylori ab+ 07/22/2011  . Hematochezia 07/22/2011  . Abdominal pain, other specified site 07/22/2011    Past Surgical History:  Procedure Laterality Date  . CHOLECYSTECTOMY  2009  . COLONOSCOPY WITH PROPOFOL N/A 02/01/2013   Performed by  Danie Binder, MD at [facility]  . COLONOSCOPY WITH PROPOFOL N/A 09/23/2011   Performed by Danie Binder, MD at [facility]  . ESOPHAGOGASTRODUODENOSCOPY  April 2013   SLF: esophageal stricture, gastritis, hiatal hernia, negative H.pylori  . ESOPHAGOGASTRODUODENOSCOPY (EGD) WITH ESOPHAGEAL DILATION N/A 02/01/2013   Performed by Danie Binder, MD at [facility]  . ESOPHAGOGASTRODUODENOSCOPY (EGD) WITH ESOPHAGEAL DILATION  09/23/2011   Performed by Danie Binder, MD at [facility]  . ILEOColonoscopy  09/23/2011   HAL:PFXTKW, multiple in the rectum/Polyps, multiple in the distal transverse colon/Diverticulosis,mild,left sided diverticulosis/Internal hemorrhoids/path: tubular adenomas  . TUBAL LIGATION  1993    OB History    No data available       Home Medications    Prior to Admission medications   Medication Sig Start Date End Date Taking? Authorizing Provider  naloxone Karma Greaser) 2 MG/2ML injection Inject with concern for opiate overdose 09/30/16   Long, Wonda Olds, MD    Family History Family History  Problem Relation Age of Onset  . Heart attack Mother   . Heart attack Father   . Colon cancer Neg Hx   . Anesthesia problems Neg Hx   . Hypotension Neg Hx   . Malignant hyperthermia Neg Hx   . Pseudochol deficiency Neg Hx     Social History Social History   Tobacco Use  . Smoking status: Current  Every Day Smoker    Packs/day: 0.50    Years: 25.00    Pack years: 12.50    Types: Cigarettes  . Smokeless tobacco: Never Used  Substance Use Topics  . Alcohol use: No    Comment: rarely  . Drug use: No     Allergies   Hydrocodone and Codeine   Review of Systems Review of Systems  Constitutional: Negative for activity change, appetite change, fatigue and fever.  HENT: Negative for congestion and sinus pain.   Eyes: Positive for photophobia.  Respiratory: Negative for chest tightness, shortness of breath and wheezing.   Cardiovascular: Negative for chest pain.  Gastrointestinal:  Positive for nausea. Negative for abdominal pain and vomiting.  Genitourinary: Negative for dysuria, vaginal bleeding and vaginal discharge.  Musculoskeletal: Negative for arthralgias and myalgias.  Skin: Negative for rash.  Neurological: Positive for dizziness, weakness and headaches. Negative for seizures and numbness. Syncope: .ro.    all other systems are negative except as noted in the HPI and PMH.    Physical Exam Updated Vital Signs BP (!) 128/99 (BP Location: Right Arm)   Pulse 80   Temp 97.7 F (36.5 C) (Oral)   Resp 20   Ht 5\' 4"  (1.626 m)   Wt 83.9 kg (185 lb)   SpO2 94%   BMI 31.76 kg/m   Physical Exam  Constitutional: She is oriented to person, place, and time. She appears well-developed and well-nourished. No distress.  HENT:  Head: Normocephalic and atraumatic.  Mouth/Throat: Oropharynx is clear and moist. No oropharyngeal exudate.  Eyes: Conjunctivae and EOM are normal. Pupils are equal, round, and reactive to light.  Neck: Normal range of motion. Neck supple.  No meningismus.  Cardiovascular: Normal rate, regular rhythm, normal heart sounds and intact distal pulses.  No murmur heard. Pulmonary/Chest: Effort normal and breath sounds normal. No respiratory distress.  Abdominal: Soft. There is no tenderness. There is no rebound and no guarding.  Musculoskeletal: Normal range of motion. She exhibits no edema or tenderness.  Neurological: She is alert and oriented to person, place, and time. No cranial nerve deficit. She exhibits normal muscle tone. Coordination normal.  CN 2-12 intact, no ataxia on finger to nose, right sided horizontal fatigable nystagmus 5/5 strength throughout, no pronator drift, Romberg negative, normal gait.  Right-sided nystagmus with head impulse testing.  Test of skew negative   Skin: Skin is warm.  Psychiatric: She has a normal mood and affect. Her behavior is normal.  Nursing note and vitals reviewed.    ED Treatments / Results    Labs (all labs ordered are listed, but only abnormal results are displayed) Labs Reviewed  CBC WITH DIFFERENTIAL/PLATELET - Abnormal; Notable for the following components:      Result Value   WBC 11.0 (*)    Neutro Abs 8.2 (*)    All other components within normal limits  BASIC METABOLIC PANEL - Abnormal; Notable for the following components:   Potassium 3.3 (*)    Glucose, Bld 109 (*)    All other components within normal limits    EKG  EKG Interpretation None       Radiology Ct Head Wo Contrast  Result Date: 05/12/2017 CLINICAL DATA:  Pt c/o dizziness and "feeling funny" while at work x 1 hour ago; pt states she started to see black spots; Pt c/o headache EXAM: CT HEAD WITHOUT CONTRAST TECHNIQUE: Contiguous axial images were obtained from the base of the skull through the vertex without intravenous contrast. COMPARISON:  None. FINDINGS: Brain: No evidence of acute infarction, hemorrhage, hydrocephalus, extra-axial collection or mass lesion/mass effect. Vascular: No hyperdense vessel or unexpected calcification. Skull: Normal. Negative for fracture or focal lesion. Sinuses/Orbits: No acute finding. Other: None. IMPRESSION: No acute intracranial abnormalities. Electronically Signed   By: Lucienne Capers M.D.   On: 05/12/2017 03:25    Procedures Procedures (including critical care time)  Medications Ordered in ED Medications  metoCLOPramide (REGLAN) injection 10 mg (not administered)  diphenhydrAMINE (BENADRYL) injection 25 mg (not administered)  ketorolac (TORADOL) 30 MG/ML injection 30 mg (not administered)  sodium chloride 0.9 % bolus 1,000 mL (not administered)     Initial Impression / Assessment and Plan / ED Course  I have reviewed the triage vital signs and the nursing notes.  Pertinent labs & imaging results that were available during my care of the patient were reviewed by me and considered in my medical decision making (see chart for details).    Patient  sudden onset dizziness with room spinning, nausea and headache while at work about 12 midnight.  No focal neurological deficits.  Patient does have right-sided nystagmus.  No other neurological deficits. Orthostatics are positive by heart rate. IV fluids given.  CT head negative.  Patient feels improved after Reglan, Benadryl and Toradol.  Exam is consistent with likely peripheral vertigo.  Low suspicion for central vertigo.  She has no ataxia. She does have right-sided nystagmus which is fatigable.  Onset was sudden.  Patient sleepy after receiving medications in the ED.  She is tolerating p.o. and ambulatory.  No further episodes of dizziness.  Will plan discharge with meclizine as well as Zofran.  Follow-up with PCP and neurology.  Return precautions discussed.  Final Clinical Impressions(s) / ED Diagnoses   Final diagnoses:  Vertigo    ED Discharge Orders    None       Kandiss Ihrig, Annie Main, MD 05/12/17 [phone]

## 2017-06-07 ENCOUNTER — Emergency Department (HOSPITAL_COMMUNITY)
Admission: EM | Admit: 2017-06-07 | Discharge: 2017-06-07 | Disposition: A | Discharge status: Home / Self Care | Payer: BLUE CROSS/BLUE SHIELD | Attending: Emergency Medicine | Admitting: Emergency Medicine

## 2017-06-07 ENCOUNTER — Emergency Department (HOSPITAL_COMMUNITY): Payer: BLUE CROSS/BLUE SHIELD

## 2017-06-07 ENCOUNTER — Encounter (HOSPITAL_COMMUNITY): Payer: Self-pay | Admitting: Emergency Medicine

## 2017-06-07 DIAGNOSIS — Y92410 Unspecified street and highway as the place of occurrence of the external cause: Secondary | ICD-10-CM | POA: Diagnosis not present

## 2017-06-07 DIAGNOSIS — M79671 Pain in right foot: Secondary | ICD-10-CM | POA: Diagnosis not present

## 2017-06-07 DIAGNOSIS — M545 Low back pain: Secondary | ICD-10-CM | POA: Diagnosis not present

## 2017-06-07 DIAGNOSIS — Y9389 Activity, other specified: Secondary | ICD-10-CM | POA: Diagnosis not present

## 2017-06-07 DIAGNOSIS — F17210 Nicotine dependence, cigarettes, uncomplicated: Secondary | ICD-10-CM | POA: Insufficient documentation

## 2017-06-07 DIAGNOSIS — R200 Anesthesia of skin: Secondary | ICD-10-CM | POA: Insufficient documentation

## 2017-06-07 DIAGNOSIS — Z79899 Other long term (current) drug therapy: Secondary | ICD-10-CM | POA: Insufficient documentation

## 2017-06-07 DIAGNOSIS — M5136 Other intervertebral disc degeneration, lumbar region: Secondary | ICD-10-CM | POA: Diagnosis not present

## 2017-06-07 DIAGNOSIS — Y999 Unspecified external cause status: Secondary | ICD-10-CM | POA: Insufficient documentation

## 2017-06-07 DIAGNOSIS — R202 Paresthesia of skin: Secondary | ICD-10-CM | POA: Diagnosis not present

## 2017-06-07 DIAGNOSIS — S3992XA Unspecified injury of lower back, initial encounter: Secondary | ICD-10-CM | POA: Diagnosis not present

## 2017-06-07 MED ORDER — PREDNISONE 50 MG PO TABS
60.0000 mg | ORAL_TABLET | Freq: Once | ORAL | Status: AC
  Administered 2017-06-07: 60 mg via ORAL
  Filled 2017-06-07: qty 1

## 2017-06-07 MED ORDER — PREDNISONE 10 MG PO TABS: ORAL_TABLET | ORAL | 0 refills | Status: DC

## 2017-06-07 NOTE — ED Notes (Signed)
Patient transported to CT 
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
## 2017-06-07 NOTE — Discharge Instructions (Signed)
Take your next dose of prednisone tomorrow evening.  Call Dr. Kathyrn Sheriff as discussed for an appointment for further evaluation of your symptoms.  In the interim,  get rechecked immediately for any worsened symptoms including weakness in your extremity or any loss of control of your bladder as discussed.

## 2017-09-23 DIAGNOSIS — F17200 Nicotine dependence, unspecified, uncomplicated: Secondary | ICD-10-CM | POA: Diagnosis not present

## 2017-09-23 DIAGNOSIS — H8149 Vertigo of central origin, unspecified ear: Secondary | ICD-10-CM | POA: Diagnosis not present

## 2017-09-23 DIAGNOSIS — Z6829 Body mass index (BMI) 29.0-29.9, adult: Secondary | ICD-10-CM | POA: Diagnosis not present

## 2017-09-23 DIAGNOSIS — K219 Gastro-esophageal reflux disease without esophagitis: Secondary | ICD-10-CM | POA: Diagnosis not present

## 2017-09-28 DIAGNOSIS — R202 Paresthesia of skin: Secondary | ICD-10-CM | POA: Diagnosis not present

## 2017-09-28 DIAGNOSIS — H8149 Vertigo of central origin, unspecified ear: Secondary | ICD-10-CM | POA: Diagnosis not present

## 2017-10-05 ENCOUNTER — Other Ambulatory Visit: Payer: Self-pay | Admitting: Internal Medicine

## 2017-10-05 ENCOUNTER — Ambulatory Visit (HOSPITAL_COMMUNITY)
Admission: RE | Admit: 2017-10-05 | Discharge: 2017-10-05 | Disposition: A | Discharge status: Home / Self Care | Payer: BLUE CROSS/BLUE SHIELD | Source: Ambulatory Visit | Attending: Internal Medicine | Admitting: Internal Medicine

## 2017-10-05 DIAGNOSIS — H8149 Vertigo of central origin, unspecified ear: Secondary | ICD-10-CM | POA: Diagnosis not present

## 2017-10-05 DIAGNOSIS — Z6829 Body mass index (BMI) 29.0-29.9, adult: Secondary | ICD-10-CM | POA: Diagnosis not present

## 2017-10-05 DIAGNOSIS — M792 Neuralgia and neuritis, unspecified: Secondary | ICD-10-CM

## 2017-10-05 DIAGNOSIS — R202 Paresthesia of skin: Secondary | ICD-10-CM | POA: Diagnosis not present

## 2017-10-05 DIAGNOSIS — M47816 Spondylosis without myelopathy or radiculopathy, lumbar region: Secondary | ICD-10-CM | POA: Insufficient documentation

## 2017-10-05 DIAGNOSIS — M545 Low back pain: Secondary | ICD-10-CM | POA: Diagnosis not present

## 2017-10-05 DIAGNOSIS — K219 Gastro-esophageal reflux disease without esophagitis: Secondary | ICD-10-CM | POA: Diagnosis not present

## 2017-10-05 DIAGNOSIS — Z0000 Encounter for general adult medical examination without abnormal findings: Secondary | ICD-10-CM | POA: Diagnosis not present

## 2017-10-05 DIAGNOSIS — F17200 Nicotine dependence, unspecified, uncomplicated: Secondary | ICD-10-CM | POA: Diagnosis not present

## 2017-11-13 DIAGNOSIS — Z6827 Body mass index (BMI) 27.0-27.9, adult: Secondary | ICD-10-CM | POA: Diagnosis not present

## 2017-11-13 DIAGNOSIS — R1010 Upper abdominal pain, unspecified: Secondary | ICD-10-CM | POA: Diagnosis not present

## 2017-11-17 ENCOUNTER — Other Ambulatory Visit (HOSPITAL_COMMUNITY): Payer: Self-pay | Admitting: Adult Health Nurse Practitioner

## 2017-11-17 ENCOUNTER — Ambulatory Visit (HOSPITAL_COMMUNITY): Admission: RE | Admit: 2017-11-17 | Payer: BLUE CROSS/BLUE SHIELD | Source: Ambulatory Visit

## 2017-11-17 DIAGNOSIS — R1011 Right upper quadrant pain: Secondary | ICD-10-CM

## 2018-01-19 DIAGNOSIS — K219 Gastro-esophageal reflux disease without esophagitis: Secondary | ICD-10-CM | POA: Diagnosis not present

## 2018-01-19 DIAGNOSIS — Z0000 Encounter for general adult medical examination without abnormal findings: Secondary | ICD-10-CM | POA: Diagnosis not present

## 2018-01-19 DIAGNOSIS — R7301 Impaired fasting glucose: Secondary | ICD-10-CM | POA: Diagnosis not present

## 2018-01-29 DIAGNOSIS — M545 Low back pain: Secondary | ICD-10-CM | POA: Diagnosis not present

## 2018-01-29 DIAGNOSIS — K219 Gastro-esophageal reflux disease without esophagitis: Secondary | ICD-10-CM | POA: Diagnosis not present

## 2018-01-29 DIAGNOSIS — E782 Mixed hyperlipidemia: Secondary | ICD-10-CM | POA: Diagnosis not present

## 2018-01-29 DIAGNOSIS — R202 Paresthesia of skin: Secondary | ICD-10-CM | POA: Diagnosis not present

## 2018-02-01 ENCOUNTER — Other Ambulatory Visit: Payer: Self-pay | Admitting: Internal Medicine

## 2018-02-01 DIAGNOSIS — M5136 Other intervertebral disc degeneration, lumbar region: Secondary | ICD-10-CM

## 2018-02-01 DIAGNOSIS — R202 Paresthesia of skin: Secondary | ICD-10-CM

## 2018-02-05 ENCOUNTER — Ambulatory Visit (HOSPITAL_COMMUNITY): Admission: RE | Admit: 2018-02-05 | Payer: BLUE CROSS/BLUE SHIELD | Source: Ambulatory Visit

## 2018-02-16 ENCOUNTER — Ambulatory Visit (HOSPITAL_COMMUNITY)
Admission: RE | Admit: 2018-02-16 | Discharge: 2018-02-16 | Disposition: A | Discharge status: Home / Self Care | Payer: BLUE CROSS/BLUE SHIELD | Source: Ambulatory Visit | Attending: Internal Medicine | Admitting: Internal Medicine

## 2018-02-16 DIAGNOSIS — M47896 Other spondylosis, lumbar region: Secondary | ICD-10-CM | POA: Insufficient documentation

## 2018-02-16 DIAGNOSIS — M5136 Other intervertebral disc degeneration, lumbar region: Secondary | ICD-10-CM | POA: Diagnosis not present

## 2018-02-16 DIAGNOSIS — R202 Paresthesia of skin: Secondary | ICD-10-CM

## 2018-02-16 DIAGNOSIS — M545 Low back pain: Secondary | ICD-10-CM | POA: Diagnosis not present

## 2018-03-26 DIAGNOSIS — R062 Wheezing: Secondary | ICD-10-CM | POA: Diagnosis not present

## 2018-03-26 DIAGNOSIS — J40 Bronchitis, not specified as acute or chronic: Secondary | ICD-10-CM | POA: Diagnosis not present

## 2018-03-26 DIAGNOSIS — Z6829 Body mass index (BMI) 29.0-29.9, adult: Secondary | ICD-10-CM | POA: Diagnosis not present

## 2018-04-01 DIAGNOSIS — M545 Low back pain: Secondary | ICD-10-CM | POA: Diagnosis not present

## 2018-04-01 DIAGNOSIS — G8929 Other chronic pain: Secondary | ICD-10-CM | POA: Diagnosis not present

## 2018-04-01 DIAGNOSIS — J40 Bronchitis, not specified as acute or chronic: Secondary | ICD-10-CM | POA: Diagnosis not present

## 2018-04-01 DIAGNOSIS — Z6829 Body mass index (BMI) 29.0-29.9, adult: Secondary | ICD-10-CM | POA: Diagnosis not present

## 2018-04-20 ENCOUNTER — Encounter: Payer: Self-pay | Admitting: Neurology

## 2018-04-20 ENCOUNTER — Ambulatory Visit (INDEPENDENT_AMBULATORY_CARE_PROVIDER_SITE_OTHER): Payer: BLUE CROSS/BLUE SHIELD | Admitting: Neurology

## 2018-04-20 VITALS — BP 118/82 | HR 74 | Ht 64.0 in | Wt 184.2 lb

## 2018-04-20 DIAGNOSIS — M5441 Lumbago with sciatica, right side: Secondary | ICD-10-CM

## 2018-04-20 MED ORDER — DULOXETINE HCL 60 MG PO CPEP: 60.0000 mg | ORAL_CAPSULE | Freq: Every day | ORAL | 12 refills | Status: DC

## 2018-04-20 MED ORDER — MELOXICAM 15 MG PO TABS: 15.0000 mg | ORAL_TABLET | Freq: Every day | ORAL | 6 refills | Status: DC | PRN

## 2018-04-20 NOTE — Progress Notes (Signed)
PATIENT: Danielle Vazquez DOB: 07/15/1970  Chief Complaint  Patient presents with  . Numbness    Reports low back pain that radiates down bilateral legs (left side worse than right).  She has noticed numbness/tingling in her right foot.  She is currently using gabapentin 300mg  at bedtime.  She is unable to tolerate the daytime doses due to feeling off balance when she takes it.  She is also taking Aleve 220mg  once daily and Extra Strength Tylenol 500mg , 6-8 tablets per day.  Marland Kitchen PCP    Celene Squibb, MD     HISTORICAL  Danielle Vazquez is a 47 years old female, seen in request by her primary care physician Dr. Nevada Vazquez, Danielle Vazquez for evaluation of numbness, initial evaluation was on April 20, 2018.  I have reviewed and summarized the referring note from the referring physician.  She reported a history of chronic low back pain 12 years, previously on local pain management Dr. Gerarda Fraction, before she was discharged from his clinic, she reported she was taking oxycodone 30 mg 8 tablets each day  She worked Designer, fashion/clothing job, complains of worsening low back pain since 2017, radiating down her right leg to her right foot, denied persistent lower extremity sensorimotor deficit, she denies bowel and bladder incontinence,  I personally reviewed MRI of lumbar on February 16, 2018, multilevel degenerative disc disease, most severe at L5-S1, this is improved compared to 2014, disc narrowing, desiccation, with possible annular fissure, there was no significant canal or foraminal narrowing.  REVIEW OF SYSTEMS: Full 14 system review of systems performed and notable only for restless leg All other review of systems were negative.  ALLERGIES: Allergies  Allergen Reactions  . Hydrocodone   . Codeine Nausea Only and Rash    HOME MEDICATIONS: Current Outpatient Medications  Medication Sig Dispense Refill  . acetaminophen (TYLENOL) 500 MG tablet Take 1,000 mg by mouth every 6 (six) hours. She is taking 6-8 Tylenol  tablets per day.    . gabapentin (NEURONTIN) 300 MG capsule Take 300 mg by mouth 3 (three) times daily as needed.    . Naproxen Sodium (ALEVE) 220 MG CAPS Take 220 mg by mouth daily.     No current facility-administered medications for this visit.     PAST MEDICAL HISTORY: Past Medical History:  Diagnosis Date  . Anxiety   . Chronic pain syndrome   . DDD (degenerative disc disease)    has had injections in past  . Degenerative disc disease   . Diverticulosis   . Gastritis   . GERD (gastroesophageal reflux disease)   . Hiatal hernia   . Low back pain   . Numbness and tingling     PAST SURGICAL HISTORY: Past Surgical History:  Procedure Laterality Date  . CHOLECYSTECTOMY  2009  . COLONOSCOPY WITH PROPOFOL N/A 02/01/2013   MOQ:HUTMLY mucosa in the terminal ileum/Moderate diverticulosis in the sigmoid colon/ONE RECTAL POLYP REMOVED/Small internal hemorrhoids  . ESOPHAGOGASTRODUODENOSCOPY  April 2013   SLF: esophageal stricture, gastritis, hiatal hernia, negative H.pylori  . ESOPHAGOGASTRODUODENOSCOPY (EGD) WITH ESOPHAGEAL DILATION N/A 02/01/2013   YTK:PTWSFKCLE at the gastroesophageal junction/MILD Non-erosive gastritis & MODERATE DUODENITIS  . ILEOColonoscopy  09/23/2011   XNT:ZGYFVC, multiple in the rectum/Polyps, multiple in the distal transverse colon/Diverticulosis,mild,left sided diverticulosis/Internal hemorrhoids/path: tubular adenomas  . TUBAL LIGATION  1993    FAMILY HISTORY: Family History  Problem Relation Age of Onset  . Heart attack Mother   . Heart attack Father   . Colon  cancer Neg Hx   . Anesthesia problems Neg Hx   . Hypotension Neg Hx   . Malignant hyperthermia Neg Hx   . Pseudochol deficiency Neg Hx     SOCIAL HISTORY: Social History   Socioeconomic History  . Marital status: Single    Spouse name: Not on file  . Number of children: 3  . Years of education: 11th grade  . Highest education level: Not on file  Occupational History  . Occupation:  Arts administrator  Social Needs  . Financial resource strain: Not on file  . Food insecurity:    Worry: Not on file    Inability: Not on file  . Transportation needs:    Medical: Not on file    Non-medical: Not on file  Tobacco Use  . Smoking status: Current Every Day Smoker    Packs/day: 0.50    Years: 25.00    Pack years: 12.50    Types: Cigarettes  . Smokeless tobacco: Never Used  Substance and Sexual Activity  . Alcohol use: Yes    Comment: rarely  . Drug use: No  . Sexual activity: Yes    Birth control/protection: Surgical, Other-see comments  Lifestyle  . Physical activity:    Days per week: Not on file    Minutes per session: Not on file  . Stress: Not on file  Relationships  . Social connections:    Talks on phone: Not on file    Gets together: Not on file    Attends religious service: Not on file    Active member of club or organization: Not on file    Attends meetings of clubs or organizations: Not on file    Relationship status: Not on file  . Intimate partner violence:    Fear of current or ex partner: Not on file    Emotionally abused: Not on file    Physically abused: Not on file    Forced sexual activity: Not on file  Other Topics Concern  . Not on file  Social History Narrative   Lives with her daughter and grandchildren.   Right-handed.   4-5 cups caffeine per day.     PHYSICAL EXAM   Vitals:   04/20/18 1325  BP: 118/82  Pulse: 74  Weight: 184 lb 4 oz (83.6 kg)  Height: 5\' 4"  (1.626 m)    Not recorded      Body mass index is 31.63 kg/m.  PHYSICAL EXAMNIATION:  Gen: NAD, conversant, well nourised, obese, well groomed                     Cardiovascular: Regular rate rhythm, no peripheral edema, warm, nontender. Eyes: Conjunctivae clear without exudates or hemorrhage Neck: Supple, no carotid bruits. Pulmonary: Clear to auscultation bilaterally   NEUROLOGICAL EXAM:  MENTAL STATUS: Speech:    Speech is normal; fluent and  spontaneous with normal comprehension.  Cognition:     Orientation to time, place and person     Normal recent and remote memory     Normal Attention span and concentration     Normal Language, naming, repeating,spontaneous speech     Fund of knowledge   CRANIAL NERVES: CN II: Visual fields are full to confrontation. Fundoscopic exam is normal with sharp discs and no vascular changes. Pupils are round equal and briskly reactive to light. CN III, IV, VI: extraocular movement are normal. No ptosis. CN V: Facial sensation is intact to pinprick in all 3 divisions bilaterally. Corneal  responses are intact.  CN VII: Face is symmetric with normal eye closure and smile. CN VIII: Hearing is normal to rubbing fingers CN IX, X: Palate elevates symmetrically. Phonation is normal. CN XI: Head turning and shoulder shrug are intact CN XII: Tongue is midline with normal movements and no atrophy.  MOTOR: There is no pronator drift of out-stretched arms. Muscle bulk and tone are normal. Muscle strength is normal.  REFLEXES: Reflexes are 2+ and symmetric at the biceps, triceps, knees, and ankles. Plantar responses are flexor.  SENSORY: Intact to light touch, pinprick, positional sensation and vibratory sensation are intact in fingers and toes.  COORDINATION: Rapid alternating movements and fine finger movements are intact. There is no dysmetria on finger-to-nose and heel-knee-shin.    GAIT/STANCE: Posture is normal. Gait is steady with normal steps, base, arm swing, and turning. Heel and toe walking are normal. Tandem gait is normal.  Romberg is absent.   DIAGNOSTIC DATA (LABS, IMAGING, TESTING) - I reviewed patient records, labs, notes, testing and imaging myself where available.   ASSESSMENT AND PLAN  KARALYNN COTTONE is a 47 y.o. female   Chronic low back pain,  Essentially normal neurological examinations  Mild abnormality on MRI of lumbar in August 2019  Patient is asking for chronic  narcotic prescription prescription," I will buy something from street, I am hurting so much"  Continue gabapentin 300 mg 3 times a day  Add on Cymbalta 60 mg daily, Mobic 15 mg as needed  Refer to physical therapy  No neurological evaluation is needed at this time   Marcial Pacas, M.D. Ph.D.  [facility] [address], [place], [place] [address] Ph: [phone] Fax: [phone]  CC: Celene Squibb, MD

## 2018-04-27 ENCOUNTER — Ambulatory Visit (HOSPITAL_COMMUNITY): Payer: BLUE CROSS/BLUE SHIELD

## 2018-05-06 ENCOUNTER — Ambulatory Visit (HOSPITAL_COMMUNITY): Payer: BLUE CROSS/BLUE SHIELD

## 2018-08-26 ENCOUNTER — Other Ambulatory Visit (HOSPITAL_COMMUNITY): Payer: Self-pay | Admitting: Internal Medicine

## 2018-08-26 ENCOUNTER — Other Ambulatory Visit: Payer: Self-pay | Admitting: Internal Medicine

## 2018-08-26 ENCOUNTER — Ambulatory Visit (HOSPITAL_COMMUNITY)
Admission: RE | Admit: 2018-08-26 | Discharge: 2018-08-26 | Disposition: A | Discharge status: Home / Self Care | Payer: BLUE CROSS/BLUE SHIELD | Source: Ambulatory Visit | Attending: Internal Medicine | Admitting: Internal Medicine

## 2018-08-26 DIAGNOSIS — R110 Nausea: Secondary | ICD-10-CM | POA: Diagnosis not present

## 2018-08-26 DIAGNOSIS — R112 Nausea with vomiting, unspecified: Secondary | ICD-10-CM

## 2018-08-26 DIAGNOSIS — K5730 Diverticulosis of large intestine without perforation or abscess without bleeding: Secondary | ICD-10-CM | POA: Diagnosis not present

## 2018-08-26 DIAGNOSIS — R1011 Right upper quadrant pain: Secondary | ICD-10-CM

## 2018-08-26 MED ORDER — IOHEXOL 300 MG/ML  SOLN
100.0000 mL | Freq: Once | INTRAMUSCULAR | Status: AC | PRN
  Administered 2018-08-26: 75 mL via INTRAVENOUS

## 2018-09-01 DIAGNOSIS — K5710 Diverticulosis of small intestine without perforation or abscess without bleeding: Secondary | ICD-10-CM | POA: Diagnosis not present

## 2018-09-07 DIAGNOSIS — K219 Gastro-esophageal reflux disease without esophagitis: Secondary | ICD-10-CM | POA: Diagnosis not present

## 2018-09-07 DIAGNOSIS — G43009 Migraine without aura, not intractable, without status migrainosus: Secondary | ICD-10-CM | POA: Diagnosis not present

## 2018-09-09 ENCOUNTER — Encounter: Payer: Self-pay | Admitting: Gastroenterology

## 2018-09-27 ENCOUNTER — Encounter: Payer: Self-pay | Admitting: Gastroenterology

## 2018-09-27 ENCOUNTER — Other Ambulatory Visit: Payer: Self-pay

## 2018-09-27 ENCOUNTER — Ambulatory Visit (INDEPENDENT_AMBULATORY_CARE_PROVIDER_SITE_OTHER): Payer: BLUE CROSS/BLUE SHIELD | Admitting: Gastroenterology

## 2018-09-27 DIAGNOSIS — K219 Gastro-esophageal reflux disease without esophagitis: Secondary | ICD-10-CM | POA: Diagnosis not present

## 2018-09-27 DIAGNOSIS — R1011 Right upper quadrant pain: Secondary | ICD-10-CM

## 2018-09-27 MED ORDER — PANTOPRAZOLE SODIUM 40 MG PO TBEC: 40.0000 mg | DELAYED_RELEASE_TABLET | Freq: Two times a day (BID) | ORAL | 5 refills | Status: DC

## 2018-09-27 NOTE — Patient Instructions (Addendum)
1. Please limit Excedrin Migraine use as much as possible.  You can take on occasion.  It does have aspirin in it and this along with the Aleve you taking does increase your risk of developing an ulcer.  It also has Tylenol so do not use additional Tylenol within 4 to 6 hours of use. 2. Increase pantoprazole to 40 mg 30 minutes before breakfast and 30 minutes before your evening meal.  If you have gastritis or an ulcer this will help heal it.  We will continue this dose for 3 months until we see you back. 3. If you have worsening symptoms, develop blood in the stool or black stools, you will need to let us know or go to the emergency department.   Gastritis, Adult  Gastritis is swelling (inflammation) of the stomach. Gastritis can develop quickly (acute). It can also develop slowly over time (chronic). It is important to get help for this condition. If you do not get help, your stomach can bleed, and you can get sores (ulcers) in your stomach. What are the causes? This condition may be caused by:  Germs that get to your stomach.  Drinking too much alcohol.  Medicines you are taking.  Too much acid in the stomach.  A disease of the intestines or stomach.  Stress.  An allergic reaction.  Crohn's disease.  Some cancer treatments (radiation). Sometimes the cause of this condition is not known. What are the signs or symptoms? Symptoms of this condition include:  Pain in your stomach.  A burning feeling in your stomach.  Feeling sick to your stomach (nauseous).  Throwing up (vomiting).  Feeling too full after you eat.  Weight loss.  Bad breath.  Throwing up blood.  Blood in your poop (stool). How is this diagnosed? This condition may be diagnosed with:  Your medical history and symptoms.  A physical exam.  Tests. These can include: ? Blood tests. ? Stool tests. ? A procedure to look inside your stomach (upper endoscopy). ? A test in which a sample of tissue is  taken for testing (biopsy). How is this treated? Treatment for this condition depends on what caused it. You may be given:  Antibiotic medicine, if your condition was caused by germs.  H2 blockers and similar medicines, if your condition was caused by too much acid. Follow these instructions at home: Medicines  Take over-the-counter and prescription medicines only as told by your doctor.  If you were prescribed an antibiotic medicine, take it as told by your doctor. Do not stop taking it even if you start to feel better. Eating and drinking   Eat small meals often, instead of large meals.  Avoid foods and drinks that make your symptoms worse.  Drink enough fluid to keep your pee (urine) pale yellow. Alcohol use  Do not drink alcohol if: ? Your doctor tells you not to drink. ? You are pregnant, may be pregnant, or are planning to become pregnant.  If you drink alcohol: ? Limit your use to:  0-1 drink a day for women.  0-2 drinks a day for men. ? Be aware of how much alcohol is in your drink. In the [place] one drink equals one 12 oz bottle of beer (355 mL), one 5 oz glass of wine (148 mL), or one 1 oz glass of hard liquor (44 mL). General instructions  Talk with your doctor about ways to manage stress. You can exercise or do deep breathing, meditation, or yoga.  Do  not smoke or use products that have nicotine or tobacco. If you need help quitting, ask your doctor.  Keep all follow-up visits as told by your doctor. This is important. Contact a doctor if:  Your symptoms get worse.  Your symptoms go away and then come back. Get help right away if:  You throw up blood or something that looks like coffee grounds.  You have black or dark red poop.  You throw up any time you try to drink fluids.  Your stomach pain gets worse.  You have a fever.  You do not feel better after one week. Summary  Gastritis is swelling (inflammation) of the stomach.  You must get help  for this condition. If you do not get help, your stomach can bleed, and you can get sores (ulcers).  This condition is diagnosed with medical history, physical exam, or tests.  You can be treated with medicines for germs or medicines to block too much acid in your stomach. This information is not intended to replace advice given to you by your health care provider. Make sure you discuss any questions you have with your health care provider. Document Released: 11/26/2007 Document Revised: 10/27/2017 Document Reviewed: 10/27/2017 Elsevier Interactive Patient Education  2019 [employer].

## 2018-09-27 NOTE — Progress Notes (Signed)
Primary Care Physician:  Celene Squibb, MD Primary GI:  Barney Drain, MD Referring Physician: Celene Squibb, MD   Patient Location: home  Provider Location: [facility] office  Reason for Visit: diverticulitis  Persons present on the virtual encounter, with roles: patient, myself (provider), Zara Council LPN (updating meds/allergies)  Total time (minutes) spent on medical discussion: 25 minutes  Due to COVID-19, visit was conducted using Zoom method.  Visit was requested by patient.  Virtual Visit via Zoom  I connected with Danielle Vazquez on 09/27/18 at  8:50 AM EDT by Zoom and verified that I am speaking with the correct person using two identifiers.   I discussed the limitations, risks, security and privacy concerns of performing an evaluation and management service by telephone/video and the availability of in person appointments. I also discussed with the patient that there may be a patient responsible charge related to this service. The patient expressed understanding and agreed to proceed.   HPI:   Danielle Vazquez is a 48 y.o. female who presents for virtual visit regarding: Abdominal pain/diverticulitis.  She presents at the request of Dr. Nevada Crane.  She had a CT on August 26, 2018 with contrast which showed scattered diverticulosis within the sigmoid and descending colon but no focal inflammatory change to suggest acute diverticulitis.  EGD/TCS8/2014. See PSH.   C/O RUQ and into middle of back. Feels like someone pulling her into. Going on for six weeks. Not every day. Has happened with and without meals. No heavy lifting. Some twisting and turning at work. Some nausea but no vomiting. Some days heartburn really bad. Two weeks ago took one pantoprazole every day. Last week no pantoprazole. Some solid food dysphagia especially with meat. Dilation previously helped. No problems until about 2 months. Steak got stuck and had to throw up. BM daily. No melena, brbpr. No straining.   PCP  gave Bentyl for RUQ pain. Not helping. For chronic pain she takes Aleve once a day. Tylenol four times a day. Recently started Excedrine Migraine as needed. Sometimes couple of times er week.    Current Outpatient Medications  Medication Sig Dispense Refill  . acetaminophen (TYLENOL) 500 MG tablet Take 1,000 mg by mouth every 6 (six) hours. She is taking 6-8 Tylenol tablets per day.    . dicyclomine (BENTYL) 20 MG tablet Take 1 tablet by mouth as needed.    . gabapentin (NEURONTIN) 300 MG capsule Take 300 mg by mouth 3 (three) times daily as needed.    . Naproxen Sodium (ALEVE) 220 MG CAPS Take 220 mg by mouth daily.    . ondansetron (ZOFRAN) 4 MG tablet Take 1 tablet by mouth as needed.    . pantoprazole (PROTONIX) 40 MG tablet Take 1 tablet by mouth as needed.     No current facility-administered medications for this visit.     Past Medical History:  Diagnosis Date  . Anxiety   . Chronic pain syndrome   . DDD (degenerative disc disease)    has had injections in past  . Degenerative disc disease   . Diverticulosis   . Gastritis   . GERD (gastroesophageal reflux disease)   . Hiatal hernia   . Low back pain   . Migraines   . Numbness and tingling     Past Surgical History:  Procedure Laterality Date  . CHOLECYSTECTOMY  2009  . COLONOSCOPY WITH PROPOFOL N/A 02/01/2013   XBL:TJQZES mucosa in the terminal ileum/Moderate diverticulosis in the  sigmoid colon/ONE RECTAL POLYP REMOVED/Small internal hemorrhoids.  Random colon biopsies negative.  Rectal polyps hyperplastic.  Next colonoscopy in 10 years with MAC  . ESOPHAGOGASTRODUODENOSCOPY  April 2013   SLF: esophageal stricture, gastritis, hiatal hernia, negative H.pylori  . ESOPHAGOGASTRODUODENOSCOPY (EGD) WITH ESOPHAGEAL DILATION N/A 02/01/2013   RCV:ELFYBOFBP at the gastroesophageal junction/MILD Non-erosive gastritis & MODERATE DUODENITIS.  Duodenal biopsies benign.  No abnormalities.  Stomach showed mild chronic inflammation, no H.  pylori.  Esophageal biopsies consistent with GERD.  Marland Kitchen ILEOColonoscopy  09/23/2011   ZWC:HENIDP, multiple in the rectum/Polyps, multiple in the distal transverse colon/Diverticulosis,mild,left sided diverticulosis/Internal hemorrhoids/path: tubular adenomas  . TUBAL LIGATION  1993    Family History  Problem Relation Age of Onset  . Heart attack Mother   . Heart attack Father   . Colon cancer Neg Hx   . Anesthesia problems Neg Hx   . Hypotension Neg Hx   . Malignant hyperthermia Neg Hx   . Pseudochol deficiency Neg Hx     Social History   Socioeconomic History  . Marital status: Single    Spouse name: Not on file  . Number of children: 3  . Years of education: 11th grade  . Highest education level: Not on file  Occupational History  . Occupation: Arts administrator  Social Needs  . Financial resource strain: Not on file  . Food insecurity:    Worry: Not on file    Inability: Not on file  . Transportation needs:    Medical: Not on file    Non-medical: Not on file  Tobacco Use  . Smoking status: Current Every Day Smoker    Packs/day: 0.50    Years: 25.00    Pack years: 12.50    Types: Cigarettes  . Smokeless tobacco: Never Used  Substance and Sexual Activity  . Alcohol use: Not Currently    Comment: rarely  . Drug use: No  . Sexual activity: Yes    Birth control/protection: Surgical, Other-see comments  Lifestyle  . Physical activity:    Days per week: Not on file    Minutes per session: Not on file  . Stress: Not on file  Relationships  . Social connections:    Talks on phone: Not on file    Gets together: Not on file    Attends religious service: Not on file    Active member of club or organization: Not on file    Attends meetings of clubs or organizations: Not on file    Relationship status: Not on file  . Intimate partner violence:    Fear of current or ex partner: Not on file    Emotionally abused: Not on file    Physically abused: Not on file     Forced sexual activity: Not on file  Other Topics Concern  . Not on file  Social History Narrative   Lives with her daughter and grandchildren.   Right-handed.   4-5 cups caffeine per day.      ROS:  General: Negative for anorexia, weight loss, fever, chills, fatigue, weakness. Eyes: Negative for vision changes.  ENT: Negative for hoarseness, difficulty swallowing , nasal congestion. CV: Negative for chest pain, angina, palpitations, dyspnea on exertion, peripheral edema.  Respiratory: Negative for dyspnea at rest, dyspnea on exertion, cough, sputum, wheezing.  GI: See history of present illness. GU:  Negative for dysuria, hematuria, urinary incontinence, urinary frequency, nocturnal urination.  MS: + for joint pain, low back pain.  Derm: Negative for rash or itching.  Neuro: Negative for weakness, abnormal sensation, seizure, frequent headaches, memory loss, confusion.  Psych: Negative for anxiety, depression, suicidal ideation, hallucinations.  Endo: Negative for unusual weight change.  Heme: Negative for bruising or bleeding. Allergy: Negative for rash or hives.   Observations/Objective: Pleasant, well nourished, NAD. No jaundice. Good skin tone. Upper teeth poor repair. Otherwise exam unavailable.   Assessment and Plan: 48 y/o female with six week h/o RUQ tenderness associated with nausea. Takes PPI intermittently for "bad heartburn". CT failed to show cause of her pain. Some postprandial component to her pain but not always related to meals. She takes Aleve daily and additional Excedrine Migraine (with ASA) as needed for headache. She may have gastritis/duodenitis or PUD. Less likely biliary etiology s/p cholecystectomy.   Will increase pantoprazole to 64m BID. Limit excedrin Migraine. Call with worsening symptoms or lack of improvement of symptoms. Discussed warning symptoms. Will hold off on EGD for now given COVID19 crisis and lack of emergent need for EGD. Will plan for OV  in 3 months.   Follow Up Instructions:    I discussed the assessment and treatment plan with the patient. The patient was provided an opportunity to ask questions and all were answered. The patient agreed with the plan and demonstrated an understanding of the instructions. AVS mailed to patient's home address.   The patient was advised to call back or seek an in-person evaluation if the symptoms worsen or if the condition fails to improve as anticipated.  I provided 25 minutes of virtual face-to-face time during this encounter.   LNeil Crouch PA-C

## 2018-09-28 NOTE — Progress Notes (Signed)
CC'ED TO PCP 

## 2018-11-30 DIAGNOSIS — M25512 Pain in left shoulder: Secondary | ICD-10-CM | POA: Diagnosis not present
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
## 2018-12-28 DIAGNOSIS — R42 Dizziness and giddiness: Secondary | ICD-10-CM | POA: Diagnosis not present

## 2018-12-31 DIAGNOSIS — E782 Mixed hyperlipidemia: Secondary | ICD-10-CM | POA: Diagnosis not present

## 2018-12-31 DIAGNOSIS — Z6829 Body mass index (BMI) 29.0-29.9, adult: Secondary | ICD-10-CM | POA: Diagnosis not present

## 2019-01-04 DIAGNOSIS — M545 Low back pain: Secondary | ICD-10-CM | POA: Diagnosis not present

## 2019-01-04 DIAGNOSIS — K219 Gastro-esophageal reflux disease without esophagitis: Secondary | ICD-10-CM | POA: Diagnosis not present

## 2019-01-04 DIAGNOSIS — E782 Mixed hyperlipidemia: Secondary | ICD-10-CM | POA: Diagnosis not present

## 2019-01-04 DIAGNOSIS — Z0001 Encounter for general adult medical examination with abnormal findings: Secondary | ICD-10-CM | POA: Diagnosis not present

## 2019-01-18 ENCOUNTER — Telehealth: Payer: Self-pay | Admitting: Gastroenterology

## 2019-01-18 ENCOUNTER — Encounter: Payer: Self-pay | Admitting: Gastroenterology

## 2019-01-18 ENCOUNTER — Ambulatory Visit: Payer: BLUE CROSS/BLUE SHIELD | Admitting: Gastroenterology

## 2019-01-18 NOTE — Telephone Encounter (Signed)
Patient was a no show and letter sent  °

## 2019-01-24 DIAGNOSIS — M79672 Pain in left foot: Secondary | ICD-10-CM | POA: Diagnosis not present

## 2019-02-13 DIAGNOSIS — G501 Atypical facial pain: Secondary | ICD-10-CM | POA: Diagnosis not present

## 2019-02-13 DIAGNOSIS — J329 Chronic sinusitis, unspecified: Secondary | ICD-10-CM | POA: Diagnosis not present

## 2019-02-13 DIAGNOSIS — K047 Periapical abscess without sinus: Secondary | ICD-10-CM | POA: Diagnosis not present

## 2019-02-14 DIAGNOSIS — K047 Periapical abscess without sinus: Secondary | ICD-10-CM | POA: Diagnosis not present

## 2019-03-25 ENCOUNTER — Other Ambulatory Visit: Payer: Self-pay | Admitting: Internal Medicine

## 2019-03-25 ENCOUNTER — Ambulatory Visit (HOSPITAL_COMMUNITY)
Admission: RE | Admit: 2019-03-25 | Discharge: 2019-03-25 | Disposition: A | Discharge status: Home / Self Care | Payer: BC Managed Care – PPO | Source: Ambulatory Visit | Attending: Internal Medicine | Admitting: Internal Medicine

## 2019-03-25 ENCOUNTER — Other Ambulatory Visit (HOSPITAL_COMMUNITY): Payer: Self-pay | Admitting: Internal Medicine

## 2019-03-25 ENCOUNTER — Other Ambulatory Visit: Payer: Self-pay

## 2019-03-25 DIAGNOSIS — R109 Unspecified abdominal pain: Secondary | ICD-10-CM

## 2019-03-25 DIAGNOSIS — R34 Anuria and oliguria: Secondary | ICD-10-CM | POA: Diagnosis not present

## 2019-03-25 DIAGNOSIS — R319 Hematuria, unspecified: Secondary | ICD-10-CM | POA: Diagnosis not present

## 2019-03-25 DIAGNOSIS — R112 Nausea with vomiting, unspecified: Secondary | ICD-10-CM | POA: Diagnosis not present

## 2019-03-25 DIAGNOSIS — R10812 Left upper quadrant abdominal tenderness: Secondary | ICD-10-CM | POA: Diagnosis not present

## 2019-03-25 DIAGNOSIS — R1111 Vomiting without nausea: Secondary | ICD-10-CM | POA: Diagnosis not present

## 2019-04-04 ENCOUNTER — Encounter: Payer: Self-pay | Admitting: Adult Health

## 2019-04-04 ENCOUNTER — Ambulatory Visit (INDEPENDENT_AMBULATORY_CARE_PROVIDER_SITE_OTHER): Payer: BC Managed Care – PPO | Admitting: Adult Health

## 2019-04-04 ENCOUNTER — Other Ambulatory Visit: Payer: Self-pay

## 2019-04-04 VITALS — BP 113/78 | HR 86 | Ht 65.0 in | Wt 200.0 lb

## 2019-04-04 DIAGNOSIS — Z1211 Encounter for screening for malignant neoplasm of colon: Secondary | ICD-10-CM | POA: Diagnosis not present

## 2019-04-04 DIAGNOSIS — R102 Pelvic and perineal pain: Secondary | ICD-10-CM | POA: Diagnosis not present

## 2019-04-04 DIAGNOSIS — R350 Frequency of micturition: Secondary | ICD-10-CM | POA: Diagnosis not present

## 2019-04-04 DIAGNOSIS — Z1212 Encounter for screening for malignant neoplasm of rectum: Secondary | ICD-10-CM

## 2019-04-04 DIAGNOSIS — N390 Urinary tract infection, site not specified: Secondary | ICD-10-CM | POA: Insufficient documentation

## 2019-04-04 LAB — HEMOCCULT GUIAC POC 1CARD (OFFICE): Fecal Occult Blood, POC: NEGATIVE

## 2019-04-04 LAB — POCT URINALYSIS DIPSTICK OB
Blood, UA: NEGATIVE
Glucose, UA: NEGATIVE
Ketones, UA: NEGATIVE
Leukocytes, UA: NEGATIVE
Nitrite, UA: POSITIVE
POC,PROTEIN,UA: NEGATIVE

## 2019-04-04 MED ORDER — PROMETHAZINE HCL 25 MG PO TABS: 25.0000 mg | ORAL_TABLET | Freq: Four times a day (QID) | ORAL | 1 refills | Status: DC | PRN

## 2019-04-04 MED ORDER — SULFAMETHOXAZOLE-TRIMETHOPRIM 800-160 MG PO TABS: 1.0000 | ORAL_TABLET | Freq: Two times a day (BID) | ORAL | 0 refills | Status: DC

## 2019-04-04 NOTE — Progress Notes (Signed)
Patient ID: Danielle Vazquez, female   DOB: Apr 06, 1971, 48 y.o.   MRN: [id number] History of Present Illness: Danielle Vazquez is a 48 year old white female, single, PM, referred by Dr Nevada Crane for pelvic pain, that started 03/25/2019 was seen by NP and had renal CT, that showed 2 mm right kidney stone, left adrenal,small adenoma and ?right ruptured ovarian cyst ha fluid in right adnexa, and diverticula.She has urinary frequency and then pees small amount for about a month. Has not had sex in about 8 months.She has bought oxycodone 10 mg off the street for the pain so she can work., she had some 5 mg from the gout.She hopes to get in pain clinic,has talked with NP at Dr Juel Burrow in the past, has been using for about 1.5 years for pain in feet and legs, has got to work.   PCP is Dr Nevada Crane.   Current Medications, Allergies, Past Medical History, Past Surgical History, Family History and Social History were reviewed in [employer] record.     Review of Systems: Pain in pelvic area esp left side Has urinary frequency and then can't pee Some nausea     Physical Exam:BP 113/78 (BP Location: Right Arm, Patient Position: Sitting, Cuff Size: Normal)   Pulse 86   Ht 5\' 5"  (1.651 m)   Wt 200 lb (90.7 kg)   LMP 09/09/2016   BMI 33.28 kg/m urine +nitrates  General:  Well developed, well nourished, no acute distress Skin:  Warm and dry Lungs; Clear to auscultation bilaterally Cardiovascular: Regular rate and rhythm Abdomen:  Soft, non tender, no hepatosplenomegaly Pelvic:  External genitalia is normal in appearance, no lesions.  The vagina is normal in appearance. Urethra has no lesions or masses. The cervix is smooth and no CMT.  Uterus is felt to be normal size, shape, and contour, was tender..  No adnexal masses,+LLQ tenderness noted.Bladder is tender, no masses felt. Rectal: Good sphincter tone, no polyps, or hemorrhoids felt.  Hemoccult negative. Psych:  No mood changes, alert and  cooperative,seems happy Fall risk is low Examination chaperoned by Estill Bamberg Rash LPN.  Impression and Plan: 1. Urinary frequency -will rx septra ds  -will rx phenergan for nausea Meds ordered this encounter  Medications  . sulfamethoxazole-trimethoprim (BACTRIM DS) 800-160 MG tablet    Sig: Take 1 tablet by mouth 2 (two) times daily. Take 1 bid    Dispense:  14 tablet    Refill:  0    Order Specific Question:   Supervising Provider    Answer:   Elonda Husky, LUTHER H [[id number]]  . promethazine (PHENERGAN) 25 MG tablet    Sig: Take 1 tablet (25 mg total) by mouth every 6 (six) hours as needed for nausea or vomiting.    Dispense:  30 tablet    Refill:  1    Order Specific Question:   Supervising Provider    Answer:   Elonda Husky, LUTHER H [[id number]]    2. 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 Urinary tract infection without hematuria, site unspecified -will rx septra ds  4. Screening for colorectal cancer

## 2019-04-11 ENCOUNTER — Telehealth: Payer: Self-pay | Admitting: Obstetrics and Gynecology

## 2019-04-11 NOTE — Telephone Encounter (Signed)

## 2019-04-12 ENCOUNTER — Other Ambulatory Visit: Payer: BC Managed Care – PPO

## 2019-04-22 ENCOUNTER — Telehealth: Payer: Self-pay | Admitting: Obstetrics & Gynecology

## 2019-04-22 NOTE — Telephone Encounter (Signed)

## 2019-04-25 ENCOUNTER — Ambulatory Visit (INDEPENDENT_AMBULATORY_CARE_PROVIDER_SITE_OTHER): Payer: BC Managed Care – PPO

## 2019-04-25 ENCOUNTER — Other Ambulatory Visit: Payer: Self-pay

## 2019-04-25 DIAGNOSIS — R102 Pelvic and perineal pain: Secondary | ICD-10-CM | POA: Diagnosis not present

## 2019-04-25 NOTE — Progress Notes (Signed)
PELVIC US TA/TV: homogeneous retroverted uterus,wnl,thicken endometrium,EEC 6.8 mm,normal ovaries bilat,unable to slide left ovary,left adnexal pain during ultrasound,no free fluid

## 2019-04-26 ENCOUNTER — Telehealth: Payer: Self-pay | Admitting: Adult Health

## 2019-04-26 NOTE — Telephone Encounter (Signed)
Pt aware that US showed that left ovary would not slide and hat EEC ws thickened at 6.5mm, she has not had a period in over 2 years and has had not spotting, will get endometrial biopsy with Dr Glo Herring or Elonda Husky to assess.

## 2019-04-26 NOTE — Telephone Encounter (Signed)
Left message to call me about US °

## 2019-04-27 ENCOUNTER — Telehealth: Payer: Self-pay | Admitting: *Deleted

## 2019-04-27 MED ORDER — KETOROLAC TROMETHAMINE 10 MG PO TABS: 10.0000 mg | ORAL_TABLET | Freq: Four times a day (QID) | ORAL | 0 refills | Status: DC | PRN

## 2019-04-27 NOTE — Telephone Encounter (Signed)
Patient requesting that Danielle Vazquez send in pain medicine for her. She has been hurting since 4 am.

## 2019-04-27 NOTE — Telephone Encounter (Signed)
Will send in rx for Toradol, do not take with any other pain meds

## 2019-04-28 ENCOUNTER — Telehealth: Payer: Self-pay | Admitting: Adult Health

## 2019-04-28 NOTE — Telephone Encounter (Signed)

## 2019-05-02 ENCOUNTER — Other Ambulatory Visit: Payer: Self-pay

## 2019-05-02 ENCOUNTER — Encounter: Payer: Self-pay | Admitting: Obstetrics & Gynecology

## 2019-05-02 ENCOUNTER — Ambulatory Visit (INDEPENDENT_AMBULATORY_CARE_PROVIDER_SITE_OTHER): Payer: BC Managed Care – PPO | Admitting: Obstetrics & Gynecology

## 2019-05-02 VITALS — BP 127/92 | HR 81 | Ht 66.0 in | Wt 200.0 lb

## 2019-05-02 DIAGNOSIS — N840 Polyp of corpus uteri: Secondary | ICD-10-CM

## 2019-05-02 DIAGNOSIS — Z3202 Encounter for pregnancy test, result negative: Secondary | ICD-10-CM | POA: Diagnosis not present

## 2019-05-02 DIAGNOSIS — R9389 Abnormal findings on diagnostic imaging of other specified body structures: Secondary | ICD-10-CM

## 2019-05-02 DIAGNOSIS — N950 Postmenopausal bleeding: Secondary | ICD-10-CM

## 2019-05-02 LAB — POCT URINE PREGNANCY: Preg Test, Ur: NEGATIVE

## 2019-05-02 NOTE — Addendum Note (Signed)
Addended by: Armond Hang on: 05/02/2019 11:40 AM   Modules accepted: Orders

## 2019-05-02 NOTE — Progress Notes (Signed)
Endometrial Biopsy Procedure Note  Pre-operative Diagnosis: PMB with thickened endometrial stripe  Post-operative Diagnosis: same  Indications: PMB with thickene endometrium  Procedure Details   Urine pregnancy test was not done.  The risks (including infection, bleeding, pain, and uterine perforation) and benefits of the procedure were explained to the patient and Written informed consent was obtained.  Antibiotic prophylaxis against endocarditis was not indicated.   The patient was placed in the dorsal lithotomy position.  Bimanual exam showed the uterus to be in the anteroflexed position.  A Graves' speculum inserted in the vagina, and the cervix prepped with povidone iodine.  Endocervical curettage with a Kevorkian curette was not performed.   A sharp tenaculum was applied to the anterior lip of the cervix for stabilization.  A sterile uterine sound was used to sound the uterus to a depth of 5.5cm.  A Pipelle endometrial aspirator was used to sample the endometrium.  Sample was sent for pathologic examination.  Condition: Stable  Complications: None  Plan:  The patient was advised to call for any fever or for prolonged or severe pain or bleeding. She was advised to use OTC analgesics as needed for mild to moderate pain. She was advised to avoid vaginal intercourse for 48 hours or until the bleeding has completely stopped.  Attending Physician Documentation: I performed the endometrial biopsy

## 2019-05-05 ENCOUNTER — Telehealth: Payer: Self-pay | Admitting: *Deleted

## 2019-05-05 DIAGNOSIS — R319 Hematuria, unspecified: Secondary | ICD-10-CM | POA: Diagnosis not present

## 2019-05-05 DIAGNOSIS — M545 Low back pain: Secondary | ICD-10-CM | POA: Diagnosis not present

## 2019-05-05 DIAGNOSIS — R1011 Right upper quadrant pain: Secondary | ICD-10-CM | POA: Diagnosis not present

## 2019-05-05 DIAGNOSIS — R1010 Upper abdominal pain, unspecified: Secondary | ICD-10-CM | POA: Diagnosis not present

## 2019-05-05 DIAGNOSIS — N200 Calculus of kidney: Secondary | ICD-10-CM | POA: Diagnosis not present

## 2019-05-05 DIAGNOSIS — R10811 Right upper quadrant abdominal tenderness: Secondary | ICD-10-CM | POA: Diagnosis not present

## 2019-05-05 NOTE — Telephone Encounter (Signed)
Patient called requesting endometrial biopsy results.

## 2019-05-05 NOTE — Telephone Encounter (Signed)
Patient informed biopsy was benign.Advised she could discuss with Dr Elonda Husky on Monday if she had further questions.

## 2019-05-06 ENCOUNTER — Telehealth: Payer: Self-pay | Admitting: Obstetrics & Gynecology

## 2019-05-06 NOTE — Telephone Encounter (Signed)

## 2019-05-09 ENCOUNTER — Other Ambulatory Visit: Payer: Self-pay

## 2019-05-09 ENCOUNTER — Ambulatory Visit (INDEPENDENT_AMBULATORY_CARE_PROVIDER_SITE_OTHER): Payer: BC Managed Care – PPO | Admitting: Obstetrics & Gynecology

## 2019-05-09 ENCOUNTER — Encounter: Payer: Self-pay | Admitting: Gastroenterology

## 2019-05-09 ENCOUNTER — Encounter: Payer: Self-pay | Admitting: Obstetrics & Gynecology

## 2019-05-09 VITALS — BP 108/74 | HR 74 | Ht 66.0 in | Wt 204.0 lb

## 2019-05-09 DIAGNOSIS — L918 Other hypertrophic disorders of the skin: Secondary | ICD-10-CM | POA: Diagnosis not present

## 2019-05-09 NOTE — Progress Notes (Signed)
Skin Tag Removal Procedure Note  Pre-operative Diagnosis: Classic skin tags (acrochordon)  Post-operative Diagnosis: Classic skin tags (acrochordon)  Locations:right vulva  Indications: bothered by underwear  Anesthesia: not required   Procedure Details  The risks (including bleeding and infection) and benefits of the procedure and Written informed consent obtained. Using sterile 11 blade, 1 large skin tags were snipped off at their bases after cleansing with Betadine.  Bleeding was controlled by pressure and silver nitrate  Findings: Pathognomonic benign lesions  not sent for pathological exam.  Condition: Stable  Complications: none.  Plan: 1. Instructed to keep the wounds dry and covered for 24-48h and clean thereafter. 2. Warning signs of infection were reviewed.   3. Recommended that the patient use OTC acetaminophen as needed for pain.  4. Return as needed.

## 2019-05-09 NOTE — Addendum Note (Signed)
Addended by: Christiana Pellant A on: 05/09/2019 12:29 PM   Modules accepted: Orders

## 2019-05-13 ENCOUNTER — Ambulatory Visit: Payer: BC Managed Care – PPO | Admitting: Obstetrics & Gynecology

## 2019-05-15 ENCOUNTER — Emergency Department (HOSPITAL_COMMUNITY)
Admission: EM | Admit: 2019-05-15 | Discharge: 2019-05-15 | Disposition: A | Discharge status: Home / Self Care | Payer: BC Managed Care – PPO | Attending: Emergency Medicine | Admitting: Emergency Medicine

## 2019-05-15 ENCOUNTER — Emergency Department (HOSPITAL_COMMUNITY): Payer: BC Managed Care – PPO

## 2019-05-15 ENCOUNTER — Other Ambulatory Visit: Payer: Self-pay

## 2019-05-15 ENCOUNTER — Encounter (HOSPITAL_COMMUNITY): Payer: Self-pay

## 2019-05-15 DIAGNOSIS — F17210 Nicotine dependence, cigarettes, uncomplicated: Secondary | ICD-10-CM | POA: Diagnosis not present

## 2019-05-15 DIAGNOSIS — E278 Other specified disorders of adrenal gland: Secondary | ICD-10-CM | POA: Diagnosis not present

## 2019-05-15 DIAGNOSIS — K5730 Diverticulosis of large intestine without perforation or abscess without bleeding: Secondary | ICD-10-CM | POA: Diagnosis not present

## 2019-05-15 DIAGNOSIS — N189 Chronic kidney disease, unspecified: Secondary | ICD-10-CM | POA: Insufficient documentation

## 2019-05-15 DIAGNOSIS — R1031 Right lower quadrant pain: Secondary | ICD-10-CM | POA: Diagnosis not present

## 2019-05-15 DIAGNOSIS — N1330 Unspecified hydronephrosis: Secondary | ICD-10-CM | POA: Insufficient documentation

## 2019-05-15 DIAGNOSIS — N049 Nephrotic syndrome with unspecified morphologic changes: Secondary | ICD-10-CM | POA: Diagnosis not present

## 2019-05-15 DIAGNOSIS — N134 Hydroureter: Secondary | ICD-10-CM | POA: Diagnosis not present

## 2019-05-15 LAB — CBC WITH DIFFERENTIAL/PLATELET
Abs Immature Granulocytes: 0.04 10*3/uL (ref 0.00–0.07)
Basophils Absolute: 0 10*3/uL (ref 0.0–0.1)
Basophils Relative: 0 %
Eosinophils Absolute: 0.2 10*3/uL (ref 0.0–0.5)
Eosinophils Relative: 2 %
HCT: 44.3 % (ref 36.0–46.0)
Hemoglobin: 14.3 g/dL (ref 12.0–15.0)
Immature Granulocytes: 0 %
Lymphocytes Relative: 13 %
Lymphs Abs: 1.3 10*3/uL (ref 0.7–4.0)
MCH: 29.8 pg (ref 26.0–34.0)
MCHC: 32.3 g/dL (ref 30.0–36.0)
MCV: 92.3 fL (ref 80.0–100.0)
Monocytes Absolute: 0.6 10*3/uL (ref 0.1–1.0)
Monocytes Relative: 6 %
Neutro Abs: 8.2 10*3/uL — ABNORMAL HIGH (ref 1.7–7.7)
Neutrophils Relative %: 79 %
Platelets: 283 10*3/uL (ref 150–400)
RBC: 4.8 MIL/uL (ref 3.87–5.11)
RDW: 13.7 % (ref 11.5–15.5)
WBC: 10.4 10*3/uL (ref 4.0–10.5)
nRBC: 0 % (ref 0.0–0.2)

## 2019-05-15 LAB — URINALYSIS, ROUTINE W REFLEX MICROSCOPIC
Bacteria, UA: NONE SEEN
Bilirubin Urine: NEGATIVE
Glucose, UA: NEGATIVE mg/dL
Ketones, ur: NEGATIVE mg/dL
Nitrite: NEGATIVE
Protein, ur: NEGATIVE mg/dL
Specific Gravity, Urine: 1.015 (ref 1.005–1.030)
pH: 6 (ref 5.0–8.0)

## 2019-05-15 LAB — COMPREHENSIVE METABOLIC PANEL
ALT: 17 U/L (ref 0–44)
AST: 17 U/L (ref 15–41)
Albumin: 3.8 g/dL (ref 3.5–5.0)
Alkaline Phosphatase: 103 U/L (ref 38–126)
Anion gap: 6 (ref 5–15)
BUN: 15 mg/dL (ref 6–20)
CO2: 23 mmol/L (ref 22–32)
Calcium: 9.1 mg/dL (ref 8.9–10.3)
Chloride: 108 mmol/L (ref 98–111)
Creatinine, Ser: 1.1 mg/dL — ABNORMAL HIGH (ref 0.44–1.00)
GFR calc Af Amer: >60 mL/min (ref >60)
GFR calc non Af Amer: 59 mL/min — ABNORMAL LOW (ref >60)
Glucose, Bld: 121 mg/dL — ABNORMAL HIGH (ref 70–99)
Potassium: 4.2 mmol/L (ref 3.5–5.1)
Sodium: 137 mmol/L (ref 135–145)
Total Bilirubin: 0.8 mg/dL (ref 0.3–1.2)
Total Protein: 7 g/dL (ref 6.5–8.1)

## 2019-05-15 LAB — LIPASE, BLOOD: Lipase: 19 U/L (ref 11–51)

## 2019-05-15 LAB — POC URINE PREG, ED: Preg Test, Ur: NEGATIVE

## 2019-05-15 MED ORDER — HYDROMORPHONE HCL 1 MG/ML IJ SOLN
1.0000 mg | Freq: Once | INTRAMUSCULAR | Status: AC
  Administered 2019-05-15: 1 mg via INTRAVENOUS
  Filled 2019-05-15: qty 1

## 2019-05-15 MED ORDER — OXYCODONE-ACETAMINOPHEN 5-325 MG PO TABS: 1.0000 | ORAL_TABLET | Freq: Four times a day (QID) | ORAL | 0 refills | Status: DC | PRN

## 2019-05-15 MED ORDER — ONDANSETRON 8 MG PO TBDP: 8.0000 mg | ORAL_TABLET | Freq: Three times a day (TID) | ORAL | 0 refills | Status: DC | PRN

## 2019-05-15 MED ORDER — SODIUM CHLORIDE 0.9 % IV SOLN
INTRAVENOUS | Status: DC
  Administered 2019-05-15: 12:00:00 via INTRAVENOUS

## 2019-05-15 MED ORDER — IBUPROFEN 400 MG PO TABS: 400.0000 mg | ORAL_TABLET | Freq: Three times a day (TID) | ORAL | 0 refills | Status: DC | PRN

## 2019-05-15 MED ORDER — SODIUM CHLORIDE 0.9 % IV BOLUS
500.0000 mL | Freq: Once | INTRAVENOUS | Status: AC
  Administered 2019-05-15: 500 mL via INTRAVENOUS

## 2019-05-15 MED ORDER — ONDANSETRON HCL 4 MG/2ML IJ SOLN
4.0000 mg | Freq: Once | INTRAMUSCULAR | Status: AC
  Administered 2019-05-15: 4 mg via INTRAVENOUS
  Filled 2019-05-15: qty 2

## 2019-05-15 NOTE — Progress Notes (Deleted)
Referring Provider: Celene Squibb, MD Primary Care Physician:  Celene Squibb, MD Primary GI Physician: Dr. Oneida Alar  No chief complaint on file.   HPI:   Danielle Vazquez is a 48 y.o. female presenting today at the request of Dr. Nevada Crane for dysphagia.  GI past medical history includes GERD, esophageal strictures with 2 dilations in the past in 2013 and 2014, and diarrhea likely secondary to IBS and post cholecystectomy state.  Colonoscopy up-to-date in 2014 with recommendations to repeat in 2024.   Patient was last seen by our staff via virtual visit on 09/27/2018.  She reported right upper quadrant pain with associated nausea.  Occasionally postprandial.  Was taking PPI intermittently for "bad heartburn."  Prior CT in March without identified cause of her pain.  She was taking Aleve daily and additional Excedrin Migraine as needed for headaches.  Suspected gastritis/duodenitis or PUD.  Her Protonix was increased to 40 mg twice daily.  Plans to hold off on EGD given COVID-19 crisis and lack of emergent need for EGD.  Office visit in 3 months.  Patient no showed for her office visit on 01/18/2019.  Today she states     Past Medical History:  Diagnosis Date  . Anxiety   . Chronic kidney disease    kdiney stone   . Chronic pain syndrome   . DDD (degenerative disc disease)    has had injections in past  . Degenerative disc disease   . Diverticulosis   . Gastritis   . GERD (gastroesophageal reflux disease)   . Hiatal hernia   . Low back pain   . Migraines   . Numbness and tingling   . Vertigo     Past Surgical History:  Procedure Laterality Date  . CHOLECYSTECTOMY  2009  . COLONOSCOPY WITH PROPOFOL N/A 02/01/2013   TDD:UKGURK mucosa in the terminal ileum/Moderate diverticulosis in the sigmoid colon/ONE RECTAL POLYP REMOVED/Small internal hemorrhoids.  Random colon biopsies negative.  Rectal polyps hyperplastic.  Next colonoscopy in 10 years with MAC  . ESOPHAGOGASTRODUODENOSCOPY  April  2013   SLF: esophageal stricture, gastritis, hiatal hernia, negative H.pylori  . ESOPHAGOGASTRODUODENOSCOPY (EGD) WITH ESOPHAGEAL DILATION N/A 02/01/2013   YHC:WCBJSEGBT at the gastroesophageal junction/MILD Non-erosive gastritis & MODERATE DUODENITIS.  Duodenal biopsies benign.  No abnormalities.  Stomach showed mild chronic inflammation, no H. pylori.  Esophageal biopsies consistent with GERD.  Marland Kitchen ILEOColonoscopy  09/23/2011   DVV:OHYWVP, multiple in the rectum/Polyps, multiple in the distal transverse colon/Diverticulosis,mild,left sided diverticulosis/Internal hemorrhoids/path: tubular adenomas  . TUBAL LIGATION  1993  . TUBAL LIGATION  1993    Current Outpatient Medications  Medication Sig Dispense Refill  . acetaminophen (TYLENOL) 500 MG tablet Take 1,000 mg by mouth every 6 (six) hours. She is taking 6-8 Tylenol tablets per day.    . celecoxib (CELEBREX) 100 MG capsule Take 100 mg by mouth daily.    Marland Kitchen dicyclomine (BENTYL) 20 MG tablet Take 1 tablet by mouth as needed.    . gabapentin (NEURONTIN) 300 MG capsule Take 300 mg by mouth 3 (three) times daily as needed.    Marland Kitchen ibuprofen (ADVIL) 400 MG tablet Take 1 tablet (400 mg total) by mouth every 8 (eight) hours as needed. 21 tablet 0  . ketorolac (TORADOL) 10 MG tablet Take 1 tablet (10 mg total) by mouth every 6 (six) hours as needed. 20 tablet 0  . meclizine (ANTIVERT) 25 MG tablet Take 25 mg by mouth as needed for dizziness.    Marland Kitchen  Naproxen Sodium (ALEVE) 220 MG CAPS Take 220 mg by mouth daily.    . ondansetron (ZOFRAN ODT) 8 MG disintegrating tablet Take 1 tablet (8 mg total) by mouth every 8 (eight) hours as needed for nausea or vomiting. 12 tablet 0  . oxyCODONE-acetaminophen (PERCOCET/ROXICET) 5-325 MG tablet Take 1 tablet by mouth every 6 (six) hours as needed for severe pain. 12 tablet 0  . pantoprazole (PROTONIX) 40 MG tablet Take 1 tablet (40 mg total) by mouth 2 (two) times daily before a meal. (Patient not taking: Reported on  05/15/2019) 60 tablet 5  . promethazine (PHENERGAN) 25 MG tablet Take 1 tablet (25 mg total) by mouth every 6 (six) hours as needed for nausea or vomiting. 30 tablet 1  . simvastatin (ZOCOR) 20 MG tablet Take 1 tablet by mouth at bedtime.     No current facility-administered medications for this visit.    Facility-Administered Medications Ordered in Other Visits  Medication Dose Route Frequency Provider Last Rate Last Dose  . 0.9 %  sodium chloride infusion   Intravenous Continuous Dorie Rank, MD   Stopped at 05/15/19 1533    Allergies as of 05/16/2019 - Review Complete 05/15/2019  Allergen Reaction Noted  . Hydrocodone  02/12/2015  . Codeine Nausea Only and Rash 09/09/2010    Family History  Problem Relation Age of Onset  . Heart attack Mother   . Heart attack Father   . Colon cancer Neg Hx   . Anesthesia problems Neg Hx   . Hypotension Neg Hx   . Malignant hyperthermia Neg Hx   . Pseudochol deficiency Neg Hx     Social History   Socioeconomic History  . Marital status: Single    Spouse name: Not on file  . Number of children: 3  . Years of education: 11th grade  . Highest education level: Not on file  Occupational History  . Occupation: Arts administrator  Social Needs  . Financial resource strain: Not on file  . Food insecurity    Worry: Not on file    Inability: Not on file  . Transportation needs    Medical: Not on file    Non-medical: Not on file  Tobacco Use  . Smoking status: Current Every Day Smoker    Packs/day: 0.50    Years: 25.00    Pack years: 12.50    Types: Cigarettes  . Smokeless tobacco: Never Used  Substance and Sexual Activity  . Alcohol use: Not Currently    Comment: rarely  . Drug use: No  . Sexual activity: Yes    Birth control/protection: Surgical, Other-see comments, Post-menopausal    Comment: tubal  Lifestyle  . Physical activity    Days per week: Not on file    Minutes per session: Not on file  . Stress: Not on file   Relationships  . Social Herbalist on phone: Not on file    Gets together: Not on file    Attends religious service: Not on file    Active member of club or organization: Not on file    Attends meetings of clubs or organizations: Not on file    Relationship status: Not on file  Other Topics Concern  . Not on file  Social History Narrative   Lives with her daughter and grandchildren.   Right-handed.   4-5 cups caffeine per day.    Review of Systems: Gen: Denies fever, chills, anorexia. Denies fatigue, weakness, weight loss.  CV: Denies chest  pain, palpitations, syncope, peripheral edema, and claudication. Resp: Denies dyspnea at rest, cough, wheezing, coughing up blood, and pleurisy. GI: Denies vomiting blood, jaundice, and fecal incontinence.   Denies dysphagia or odynophagia. Derm: Denies rash, itching, dry skin Psych: Denies depression, anxiety, memory loss, confusion. No homicidal or suicidal ideation.  Heme: Denies bruising, bleeding, and enlarged lymph nodes.  Physical Exam: LMP 09/09/2016 Comment: neg preg General:   Alert and oriented. No distress noted. Pleasant and cooperative.  Head:  Normocephalic and atraumatic. Eyes:  Conjuctiva clear without scleral icterus. Mouth:  Oral mucosa pink and moist. Good dentition. No lesions. Heart:  S1, S2 present without murmurs appreciated. Lungs:  Clear to auscultation bilaterally. No wheezes, rales, or rhonchi. No distress.  Abdomen:  +BS, soft, non-tender and non-distended. No rebound or guarding. No HSM or masses noted. Msk:  Symmetrical without gross deformities. Normal posture. Extremities:  Without edema. Neurologic:  Alert and  oriented x4 Psych:  Alert and cooperative. Normal mood and affect.

## 2019-05-15 NOTE — Discharge Instructions (Signed)
Take the medications as needed for pain.  Follow-up with the urologist to make sure the swelling around the kidney resolves.  Return as needed for worsening symptoms.

## 2019-05-15 NOTE — ED Triage Notes (Signed)
Pt reports r flank pain that started at 3am today.  Reports nausea, no vomiting.

## 2019-05-15 NOTE — ED Provider Notes (Signed)
[facility] EMERGENCY DEPARTMENT Provider Note   CSN: [id number] Arrival date & time: 05/15/19  1052     History   Chief Complaint Chief Complaint  Patient presents with   Flank Pain    HPI Danielle Vazquez is a 48 y.o. female.     HPI Pt presented to the ED for evaluation for acute flank pain.    Patient states she woke up with severe pain in the right flank area.  Those symptoms have persisted throughout the day.  The pain is sharp and goes up and down the right torso.  Nothing seems to be helping.  The pain is bringing her to tears.  She denies any new urinary symptoms.  She had nausea but no vomiting.  No diarrhea or constipation.  No fevers or chills.  No cough or chest pain. Past Medical History:  Diagnosis Date   Anxiety    Chronic kidney disease    kdiney stone    Chronic pain syndrome    DDD (degenerative disc disease)    has had injections in past   Degenerative disc disease    Diverticulosis    Gastritis    GERD (gastroesophageal reflux disease)    Hiatal hernia    Low back pain    Migraines    Numbness and tingling    Vertigo     Patient Active Problem List   Diagnosis Date Noted   Pelvic pain 04/04/2019   Urinary tract infection without hematuria 04/04/2019   Urinary frequency 04/04/2019   RUQ pain 09/27/2018   Loose stools 01/13/2013   GERD (gastroesophageal reflux disease) 27/08/5007   Helicobacter pylori ab+ 07/22/2011   Hematochezia 07/22/2011   Abdominal pain, other specified site 07/22/2011    Past Surgical History:  Procedure Laterality Date   CHOLECYSTECTOMY  2009   COLONOSCOPY WITH PROPOFOL N/A 02/01/2013   F[facility]:WEXHBZ mucosa in the terminal ileum/Moderate diverticulosis in the sigmoid colon/ONE RECTAL POLYP REMOVED/Small internal hemorrhoids.  Random colon biopsies negative.  Rectal polyps hyperplastic.  Next colonoscopy in 10 years with MAC   ESOPHAGOGASTRODUODENOSCOPY  April 2013   SLF: esophageal stricture,  gastritis, hiatal hernia, negative H.pylori   ESOPHAGOGASTRODUODENOSCOPY (EGD) WITH ESOPHAGEAL DILATION N/A 02/01/2013   [id number]:[id number] at the gastroesophageal junction/MILD Non-erosive gastritis & MODERATE DUODENITIS.  Duodenal biopsies benign.  No abnormalities.  Stomach showed mild chronic inflammation, no H. pylori.  Esophageal biopsies consistent with GERD.   ILEOColonoscopy  09/23/2011   BPZ:WCHENI, multiple in the rectum/Polyps, multiple in the distal transverse colon/Diverticulosis,mild,left sided diverticulosis/Internal hemorrhoids/path: tubular adenomas   TUBAL LIGATION  1993   TUBAL LIGATION  1993     OB History    Gravida  3   Para  3   Term  3   Preterm      AB      Living  3     SAB      TAB      Ectopic      Multiple      Live Births  3            Home Medications    Prior to Admission medications   Medication Sig Start Date End Date Taking? Authorizing Provider  acetaminophen (TYLENOL) 500 MG tablet Take 1,000 mg by mouth every 6 (six) hours. She is taking 6-8 Tylenol tablets per day.   Yes [provider]  celecoxib (CELEBREX) 100 MG capsule Take 100 mg by mouth daily.   Yes [provider]  gabapentin (  NEURONTIN) 300 MG capsule Take 300 mg by mouth 3 (three) times daily as needed. 03/15/18  Yes [provider]  ketorolac (TORADOL) 10 MG tablet Take 1 tablet (10 mg total) by mouth every 6 (six) hours as needed. 04/27/19  Yes Estill Dooms, NP  meclizine (ANTIVERT) 25 MG tablet Take 25 mg by mouth as needed for dizziness.   Yes [provider]  Naproxen Sodium (ALEVE) 220 MG CAPS Take 220 mg by mouth daily.   Yes [provider]  promethazine (PHENERGAN) 25 MG tablet Take 1 tablet (25 mg total) by mouth every 6 (six) hours as needed for nausea or vomiting. 04/04/19  Yes Derrek Monaco A, NP  dicyclomine (BENTYL) 20 MG tablet Take 1 tablet by mouth as needed. 08/27/18   [provider]    ibuprofen (ADVIL) 400 MG tablet Take 1 tablet (400 mg total) by mouth every 8 (eight) hours as needed. 05/15/19   Dorie Rank, MD  ondansetron (ZOFRAN ODT) 8 MG disintegrating tablet Take 1 tablet (8 mg total) by mouth every 8 (eight) hours as needed for nausea or vomiting. 05/15/19   Dorie Rank, MD  oxyCODONE-acetaminophen (PERCOCET/ROXICET) 5-325 MG tablet Take 1 tablet by mouth every 6 (six) hours as needed for severe pain. 05/15/19   Dorie Rank, MD  pantoprazole (PROTONIX) 40 MG tablet Take 1 tablet (40 mg total) by mouth 2 (two) times daily before a meal. Patient not taking: Reported on 05/15/2019 09/27/18   Mahala Menghini, PA-C  simvastatin (ZOCOR) 20 MG tablet Take 1 tablet by mouth at bedtime. 01/04/19   [provider]    Family History Family History  Problem Relation Age of Onset   Heart attack Mother    Heart attack Father    Colon cancer Neg Hx    Anesthesia problems Neg Hx    Hypotension Neg Hx    Malignant hyperthermia Neg Hx    Pseudochol deficiency Neg Hx     Social History Social History   Tobacco Use   Smoking status: Current Every Day Smoker    Packs/day: 0.50    Years: 25.00    Pack years: 12.50    Types: Cigarettes   Smokeless tobacco: Never Used  Substance Use Topics   Alcohol use: Not Currently    Comment: rarely   Drug use: No     Allergies   Hydrocodone and Codeine   Review of Systems Review of Systems  All other systems reviewed and are negative.    Physical Exam Updated Vital Signs BP (!) 169/94 (BP Location: Left Arm)    Pulse 76    Temp 98.5 F (36.9 C) (Oral)    Resp 18    Ht 1.676 m (_0 )    Wt 90.7 kg    LMP 09/09/2016 Comment: neg preg   SpO2 98%    BMI 32.28 kg/m   Physical Exam Vitals signs and nursing note reviewed.  Constitutional:      Appearance: She is well-developed. She is ill-appearing.  HENT:     Head: Normocephalic and atraumatic.     Right Ear: External ear normal.     Left Ear: External  ear normal.  Eyes:     General: No scleral icterus.       Right eye: No discharge.        Left eye: No discharge.     Conjunctiva/sclera: Conjunctivae normal.  Neck:     Musculoskeletal: Neck supple.     Trachea: No  tracheal deviation.  Cardiovascular:     Rate and Rhythm: Normal rate and regular rhythm.  Pulmonary:     Effort: Pulmonary effort is normal. No respiratory distress.     Breath sounds: Normal breath sounds. No stridor. No wheezing or rales.  Abdominal:     General: Bowel sounds are normal. There is no distension.     Palpations: Abdomen is soft.     Tenderness: There is no abdominal tenderness. There is right CVA tenderness. There is no guarding or rebound.  Musculoskeletal:        General: No tenderness.  Skin:    General: Skin is warm and dry.     Findings: No rash.  Neurological:     Mental Status: She is alert.     Cranial Nerves: No cranial nerve deficit (no facial droop, extraocular movements intact, no slurred speech).     Sensory: No sensory deficit.     Motor: No abnormal muscle tone or seizure activity.     Coordination: Coordination normal.      ED Treatments / Results  Labs (all labs ordered are listed, but only abnormal results are displayed) Labs Reviewed  URINALYSIS, ROUTINE W REFLEX MICROSCOPIC - Abnormal; Notable for the following components:      Result Value   Hgb urine dipstick SMALL (*)    Leukocytes,Ua TRACE (*)    All other components within normal limits  COMPREHENSIVE METABOLIC PANEL - Abnormal; Notable for the following components:   Glucose, Bld 121 (*)    Creatinine, Ser 1.10 (*)    GFR calc non Af Amer 59 (*)    All other components within normal limits  CBC WITH DIFFERENTIAL/PLATELET - Abnormal; Notable for the following components:   Neutro Abs 8.2 (*)    All other components within normal limits  LIPASE, BLOOD  POC URINE PREG, ED    EKG None  Radiology Ct Renal Stone Study  Result Date: 05/15/2019 CLINICAL DATA:   Flank pain.  Stone disease suspected. EXAM: CT ABDOMEN AND PELVIS WITHOUT CONTRAST TECHNIQUE: Multidetector CT imaging of the abdomen and pelvis was performed following the standard protocol without IV contrast. COMPARISON:  03/25/2019 FINDINGS: Lower chest: Unremarkable Hepatobiliary: No focal abnormality in the liver on this study without intravenous contrast. Gallbladder surgically absent. No intrahepatic or extrahepatic biliary dilation. Pancreas: No focal mass lesion. No dilatation of the main duct. No intraparenchymal cyst. No peripancreatic edema. Spleen: No splenomegaly. No focal mass lesion. Adrenals/Urinary Tract: Tiny left adrenal nodule described previously is less prominent today but left adrenal tissue measures less than 10 Hounsfield units consistent with adenoma. Right adrenal gland unremarkable. Perinephric edema noted right kidney with peripelvic and proximal periureteric edema. There is mild right hydroureter. No ureteral or bladder stone is evident. Patient was noted to have a 2 mm stone in the lower pole the right kidney on the previous study which is no longer evident. Tiny calcification along the right pelvic sidewall (image 76/2) is vascular and unchanged since prior. Cortical scarring noted interpolar left kidney. No left renal or ureteral stones. Stomach/Bowel: Stomach is unremarkable. No gastric wall thickening. No evidence of outlet obstruction. Duodenum is normally positioned as is the ligament of Treitz. No small bowel wall thickening. No small bowel dilatation. The terminal ileum is normal. The appendix is best seen on coronal images and is unremarkable. No gross colonic mass. No colonic wall thickening. Diverticular changes are noted in the left colon without evidence of diverticulitis. Vascular/Lymphatic: No abdominal aortic aneurysm. No  abdominal lymphadenopathy No pelvic sidewall lymphadenopathy. Reproductive: The uterus is unremarkable.  There is no adnexal mass. Other: No  intraperitoneal free fluid. Musculoskeletal: No worrisome lytic or sclerotic osseous abnormality. IMPRESSION: 1. Fairly extensive perinephric and right periureteric edema with mild right hydroureter. No right-sided urinary stone disease on today's study but a 2 mm stone identified in the lower pole right kidney on the exam from 03/25/2019 is no longer present. There is no stone in the urinary bladder. As such, imaging features are felt to be most likely related to recent stone passage. Infection could produce similar imaging characteristics. 2. Unremarkable left kidney and ureter. Electronically Signed   By: Misty Stanley M.D.   On: 05/15/2019 14:38    Procedures Procedures (including critical care time)  Medications Ordered in ED Medications  sodium chloride 0.9 % bolus 500 mL (500 mLs Intravenous New Bag/Given 05/15/19 1203)    And  0.9 %  sodium chloride infusion ( Intravenous New Bag/Given 05/15/19 1202)  HYDROmorphone (DILAUDID) injection 1 mg (1 mg Intravenous Given 05/15/19 1202)  ondansetron (ZOFRAN) injection 4 mg (4 mg Intravenous Given 05/15/19 1202)     Initial Impression / Assessment and Plan / ED Course  I have reviewed the triage vital signs and the nursing notes.  Pertinent labs & imaging results that were available during my care of the patient were reviewed by me and considered in my medical decision making (see chart for details).  Clinical Course as of May 15 1511  Sun May 15, 2019  1112 Prior imaging tests reviewed.  History of probable ruptured ovarian cyst.  Kidney stone on the right side also noted on prior CT scan    [JK]  1208 BLood noted in urine.  CBC normal   [JK]  1512 CT scan findings reviewed.  Hydronephrosis and hydroureter but no persistent ureteral stone   [JK]    Clinical Course User Index [JK] Dorie Rank, MD     Patient presented with acute flank pain suggestive of renal colic.  CT scan does not show any evidence ureteral stone but she does have  hydronephrosis and hydroureter.  This suggests recently passed stone.  Patient was treated with pain medications and her symptoms have resolved.  She is feeling much better now and is comfortable.  I suspect that she did pass a kidney stone.  Plan on discharge home with short course of pain medications that she may have some residual discomfort with her persistent hydronephrosis.  Recommended outpatient urology follow-up.  Warning signs precautions discussed.  Final Clinical Impressions(s) / ED Diagnoses   Final diagnoses:  Hydronephrosis, unspecified hydronephrosis type    ED Discharge Orders         Ordered    oxyCODONE-acetaminophen (PERCOCET/ROXICET) 5-325 MG tablet  Every 6 hours PRN     05/15/19 1511    ondansetron (ZOFRAN ODT) 8 MG disintegrating tablet  Every 8 hours PRN     05/15/19 1511    ibuprofen (ADVIL) 400 MG tablet  Every 8 hours PRN     05/15/19 1511           Dorie Rank, MD 05/15/19 1513

## 2019-05-16 ENCOUNTER — Ambulatory Visit: Payer: BC Managed Care – PPO | Admitting: Gastroenterology

## 2019-05-16 ENCOUNTER — Encounter: Payer: Self-pay | Admitting: Gastroenterology

## 2019-05-16 ENCOUNTER — Telehealth: Payer: Self-pay | Admitting: Gastroenterology

## 2019-05-16 NOTE — Telephone Encounter (Signed)
PATIENT WAS A NO SHOW AND LETTER SENT  °

## 2019-05-16 NOTE — Telephone Encounter (Signed)
Noted  

## 2019-05-23 DIAGNOSIS — R109 Unspecified abdominal pain: Secondary | ICD-10-CM | POA: Diagnosis not present

## 2019-05-23 DIAGNOSIS — Z0489 Encounter for examination and observation for other specified reasons: Secondary | ICD-10-CM | POA: Diagnosis not present

## 2019-05-24 NOTE — Progress Notes (Deleted)
Referring Provider: Celene Squibb, MD Primary Care Physician:  Celene Squibb, MD Primary GI Physician: Dr. Oneida Alar  No chief complaint on file.   HPI:   Danielle Vazquez is a 48 y.o. female with history of cholecystectomy, GERD, and dysphagia with last EGD/ED in 2014 with stricture at the GE junction, mild nonerosive gastritis and moderate duodenitis.  Colonoscopy up-to-date in 2014.  Next colonoscopy in 2024.  She was last seen via telemedicine visit in April 2020 with complaint of right upper quadrant pain into her back x6 weeks.  Occurred with and without meals.  Some nausea without vomiting.  Also noted heartburn flares at times and return of solid food dysphagia x2 months.  Patient had CT on August 26, 2018 which showed diverticulosis without diverticulitis in the sigmoid and descending colon, no other findings to explain her abdominal pain.  Patient was taking Aleve daily and additional Excedrin Migraine as needed for headache.  Suspected gastritis/duodenitis or PUD.  Protonix was increased to 40 mg twice daily, advised to limit Excedrin Migraine, and hold off on EGD due to COVID-19 crisis. OV in 3 months.   Patient has no showed her last 2 appointments.  Today she states    Propofol  Past Medical History:  Diagnosis Date  . Anxiety   . Chronic kidney disease    kdiney stone   . Chronic pain syndrome   . DDD (degenerative disc disease)    has had injections in past  . Degenerative disc disease   . Diverticulosis   . Gastritis   . GERD (gastroesophageal reflux disease)   . Hiatal hernia   . Low back pain   . Migraines   . Numbness and tingling   . Vertigo     Past Surgical History:  Procedure Laterality Date  . CHOLECYSTECTOMY  2009  . COLONOSCOPY WITH PROPOFOL N/A 02/01/2013   XBL:[id number] mucosa in the terminal ileum/Moderate diverticulosis in the sigmoid colon/ONE RECTAL POLYP REMOVED/Small internal hemorrhoids.  Random colon biopsies negative.  Rectal polyps  hyperplastic.  Next colonoscopy in 10 years with MAC  . ESOPHAGOGASTRODUODENOSCOPY  April 2013   SLF: esophageal stricture, gastritis, hiatal hernia, negative H.pylori  . ESOPHAGOGASTRODUODENOSCOPY (EGD) WITH ESOPHAGEAL DILATION N/A 02/01/2013   [id number] at the gastroesophageal junction/MILD Non-erosive gastritis & MODERATE DUODENITIS.  Duodenal biopsies benign.  No abnormalities.  Stomach showed mild chronic inflammation, no H. pylori.  Esophageal biopsies consistent with GERD.  Marland Kitchen ILEOColonoscopy  09/23/2011   [id number], multiple in the rectum/Polyps, multiple in the distal transverse colon/Diverticulosis,mild,left sided diverticulosis/Internal hemorrhoids/path: tubular adenomas  . TUBAL LIGATION  1993  . TUBAL LIGATION  1993    Current Outpatient Medications  Medication Sig Dispense Refill  . acetaminophen (TYLENOL) 500 MG tablet Take 1,000 mg by mouth every 6 (six) hours. She is taking 6-8 Tylenol tablets per day.    . celecoxib (CELEBREX) 100 MG capsule Take 100 mg by mouth daily.    Marland Kitchen dicyclomine (BENTYL) 20 MG tablet Take 1 tablet by mouth as needed.    . gabapentin (NEURONTIN) 300 MG capsule Take 300 mg by mouth 3 (three) times daily as needed.    Marland Kitchen ibuprofen (ADVIL) 400 MG tablet Take 1 tablet (400 mg total) by mouth every 8 (eight) hours as needed. 21 tablet 0  . ketorolac (TORADOL) 10 MG tablet Take 1 tablet (10 mg total) by mouth every 6 (six) hours as needed. 20 tablet 0  . meclizine (ANTIVERT) 25 MG tablet Take 25 mg  by mouth as needed for dizziness.    . Naproxen Sodium (ALEVE) 220 MG CAPS Take 220 mg by mouth daily.    . ondansetron (ZOFRAN ODT) 8 MG disintegrating tablet Take 1 tablet (8 mg total) by mouth every 8 (eight) hours as needed for nausea or vomiting. 12 tablet 0  . oxyCODONE-acetaminophen (PERCOCET/ROXICET) 5-325 MG tablet Take 1 tablet by mouth every 6 (six) hours as needed for severe pain. 12 tablet 0  . pantoprazole (PROTONIX) 40 MG tablet Take 1 tablet (40  mg total) by mouth 2 (two) times daily before a meal. (Patient not taking: Reported on 05/15/2019) 60 tablet 5  . promethazine (PHENERGAN) 25 MG tablet Take 1 tablet (25 mg total) by mouth every 6 (six) hours as needed for nausea or vomiting. 30 tablet 1  . simvastatin (ZOCOR) 20 MG tablet Take 1 tablet by mouth at bedtime.     No current facility-administered medications for this visit.     Allergies as of 05/25/2019 - Review Complete 05/15/2019  Allergen Reaction Noted  . Hydrocodone  02/12/2015  . Codeine Nausea Only and Rash 09/09/2010    Family History  Problem Relation Age of Onset  . Heart attack Mother   . Heart attack Father   . Colon cancer Neg Hx   . Anesthesia problems Neg Hx   . Hypotension Neg Hx   . Malignant hyperthermia Neg Hx   . Pseudochol deficiency Neg Hx     Social History   Socioeconomic History  . Marital status: Single    Spouse name: Not on file  . Number of children: 3  . Years of education: 11th grade  . Highest education level: Not on file  Occupational History  . Occupation: Arts administrator  Social Needs  . Financial resource strain: Not on file  . Food insecurity    Worry: Not on file    Inability: Not on file  . Transportation needs    Medical: Not on file    Non-medical: Not on file  Tobacco Use  . Smoking status: Current Every Day Smoker    Packs/day: 0.50    Years: 25.00    Pack years: 12.50    Types: Cigarettes  . Smokeless tobacco: Never Used  Substance and Sexual Activity  . Alcohol use: Not Currently    Comment: rarely  . Drug use: No  . Sexual activity: Yes    Birth control/protection: Surgical, Other-see comments, Post-menopausal    Comment: tubal  Lifestyle  . Physical activity    Days per week: Not on file    Minutes per session: Not on file  . Stress: Not on file  Relationships  . Social Herbalist on phone: Not on file    Gets together: Not on file    Attends religious service: Not on file     Active member of club or organization: Not on file    Attends meetings of clubs or organizations: Not on file    Relationship status: Not on file  Other Topics Concern  . Not on file  Social History Narrative   Lives with her daughter and grandchildren.   Right-handed.   4-5 cups caffeine per day.    Review of Systems: Gen: Denies fever, chills, anorexia. Denies fatigue, weakness, weight loss.  CV: Denies chest pain, palpitations, syncope, peripheral edema, and claudication. Resp: Denies dyspnea at rest, cough, wheezing, coughing up blood, and pleurisy. GI: Denies vomiting blood, jaundice, and fecal incontinence.  Denies dysphagia or odynophagia. Derm: Denies rash, itching, dry skin Psych: Denies depression, anxiety, memory loss, confusion. No homicidal or suicidal ideation.  Heme: Denies bruising, bleeding, and enlarged lymph nodes.  Physical Exam: LMP 09/09/2016 Comment: neg preg General:   Alert and oriented. No distress noted. Pleasant and cooperative.  Head:  Normocephalic and atraumatic. Eyes:  Conjuctiva clear without scleral icterus. Mouth:  Oral mucosa pink and moist. Good dentition. No lesions. Heart:  S1, S2 present without murmurs appreciated. Lungs:  Clear to auscultation bilaterally. No wheezes, rales, or rhonchi. No distress.  Abdomen:  +BS, soft, non-tender and non-distended. No rebound or guarding. No HSM or masses noted. Msk:  Symmetrical without gross deformities. Normal posture. Extremities:  Without edema. Neurologic:  Alert and  oriented x4 Psych:  Alert and cooperative. Normal mood and affect.

## 2019-05-25 ENCOUNTER — Ambulatory Visit: Payer: BC Managed Care – PPO | Admitting: Gastroenterology

## 2019-05-25 ENCOUNTER — Telehealth: Payer: Self-pay | Admitting: Gastroenterology

## 2019-05-25 NOTE — Telephone Encounter (Signed)
Patient no show x 3 in last 4 months, was sent no show policy letter

## 2019-05-25 NOTE — Telephone Encounter (Signed)
Noted  

## 2019-05-25 NOTE — Telephone Encounter (Signed)
Reviewed

## 2019-06-14 DIAGNOSIS — Z87442 Personal history of urinary calculi: Secondary | ICD-10-CM | POA: Diagnosis not present

## 2019-06-14 DIAGNOSIS — N130 Hydronephrosis with ureteropelvic junction obstruction: Secondary | ICD-10-CM | POA: Diagnosis not present

## 2019-08-15 ENCOUNTER — Ambulatory Visit: Payer: BLUE CROSS/BLUE SHIELD | Attending: Internal Medicine

## 2019-08-15 ENCOUNTER — Other Ambulatory Visit: Payer: Self-pay

## 2019-08-15 DIAGNOSIS — Z20822 Contact with and (suspected) exposure to covid-19: Secondary | ICD-10-CM

## 2019-08-16 LAB — NOVEL CORONAVIRUS, NAA: SARS-CoV-2, NAA: NOT DETECTED

## 2019-09-19 DIAGNOSIS — Z79899 Other long term (current) drug therapy: Secondary | ICD-10-CM | POA: Diagnosis not present

## 2019-09-19 DIAGNOSIS — M545 Low back pain: Secondary | ICD-10-CM | POA: Diagnosis not present

## 2019-09-19 DIAGNOSIS — F17210 Nicotine dependence, cigarettes, uncomplicated: Secondary | ICD-10-CM | POA: Diagnosis not present

## 2019-09-19 DIAGNOSIS — Z0000 Encounter for general adult medical examination without abnormal findings: Secondary | ICD-10-CM | POA: Diagnosis not present

## 2019-09-19 DIAGNOSIS — Z131 Encounter for screening for diabetes mellitus: Secondary | ICD-10-CM | POA: Diagnosis not present

## 2019-09-19 DIAGNOSIS — Z13220 Encounter for screening for lipoid disorders: Secondary | ICD-10-CM | POA: Diagnosis not present

## 2019-09-19 DIAGNOSIS — R0602 Shortness of breath: Secondary | ICD-10-CM | POA: Diagnosis not present

## 2019-09-19 DIAGNOSIS — Z1159 Encounter for screening for other viral diseases: Secondary | ICD-10-CM | POA: Diagnosis not present

## 2019-09-19 DIAGNOSIS — R5383 Other fatigue: Secondary | ICD-10-CM | POA: Diagnosis not present

## 2019-09-19 DIAGNOSIS — E559 Vitamin D deficiency, unspecified: Secondary | ICD-10-CM | POA: Diagnosis not present

## 2019-09-19 DIAGNOSIS — Z1331 Encounter for screening for depression: Secondary | ICD-10-CM | POA: Diagnosis not present

## 2019-09-21 ENCOUNTER — Other Ambulatory Visit: Payer: Self-pay | Admitting: Physician Assistant

## 2019-09-21 DIAGNOSIS — E2839 Other primary ovarian failure: Secondary | ICD-10-CM

## 2019-09-21 DIAGNOSIS — Z1231 Encounter for screening mammogram for malignant neoplasm of breast: Secondary | ICD-10-CM

## 2019-10-04 ENCOUNTER — Encounter (HOSPITAL_COMMUNITY): Payer: Self-pay | Admitting: Emergency Medicine

## 2019-10-04 ENCOUNTER — Emergency Department (HOSPITAL_COMMUNITY)
Admission: EM | Admit: 2019-10-04 | Discharge: 2019-10-04 | Disposition: A | Discharge status: Home / Self Care | Payer: BC Managed Care – PPO | Attending: Emergency Medicine | Admitting: Emergency Medicine

## 2019-10-04 ENCOUNTER — Emergency Department (HOSPITAL_COMMUNITY): Payer: BC Managed Care – PPO

## 2019-10-04 ENCOUNTER — Other Ambulatory Visit: Payer: Self-pay

## 2019-10-04 DIAGNOSIS — F17210 Nicotine dependence, cigarettes, uncomplicated: Secondary | ICD-10-CM | POA: Insufficient documentation

## 2019-10-04 DIAGNOSIS — Z79899 Other long term (current) drug therapy: Secondary | ICD-10-CM | POA: Insufficient documentation

## 2019-10-04 DIAGNOSIS — K209 Esophagitis, unspecified without bleeding: Secondary | ICD-10-CM | POA: Insufficient documentation

## 2019-10-04 DIAGNOSIS — R079 Chest pain, unspecified: Secondary | ICD-10-CM | POA: Diagnosis present

## 2019-10-04 LAB — CBC
HCT: 41.9 % (ref 36.0–46.0)
Hemoglobin: 13.7 g/dL (ref 12.0–15.0)
MCH: 30.2 pg (ref 26.0–34.0)
MCHC: 32.7 g/dL (ref 30.0–36.0)
MCV: 92.5 fL (ref 80.0–100.0)
Platelets: 275 10*3/uL (ref 150–400)
RBC: 4.53 MIL/uL (ref 3.87–5.11)
RDW: 13.8 % (ref 11.5–15.5)
WBC: 7.2 10*3/uL (ref 4.0–10.5)
nRBC: 0 % (ref 0.0–0.2)

## 2019-10-04 LAB — TROPONIN I (HIGH SENSITIVITY)
Troponin I (High Sensitivity): 2 ng/L (ref <18)
Troponin I (High Sensitivity): <2 ng/L (ref <18)

## 2019-10-04 LAB — HEPATIC FUNCTION PANEL
ALT: 21 U/L (ref 0–44)
AST: 19 U/L (ref 15–41)
Albumin: 3.8 g/dL (ref 3.5–5.0)
Alkaline Phosphatase: 118 U/L (ref 38–126)
Bilirubin, Direct: 0.1 mg/dL (ref 0.0–0.2)
Indirect Bilirubin: 0.6 mg/dL (ref 0.3–0.9)
Total Bilirubin: 0.7 mg/dL (ref 0.3–1.2)
Total Protein: 6.8 g/dL (ref 6.5–8.1)

## 2019-10-04 LAB — BASIC METABOLIC PANEL
Anion gap: 8 (ref 5–15)
BUN: 15 mg/dL (ref 6–20)
CO2: 25 mmol/L (ref 22–32)
Calcium: 8.5 mg/dL — ABNORMAL LOW (ref 8.9–10.3)
Chloride: 103 mmol/L (ref 98–111)
Creatinine, Ser: 0.91 mg/dL (ref 0.44–1.00)
GFR calc Af Amer: >60 mL/min (ref >60)
GFR calc non Af Amer: >60 mL/min (ref >60)
Glucose, Bld: 132 mg/dL — ABNORMAL HIGH (ref 70–99)
Potassium: 3.9 mmol/L (ref 3.5–5.1)
Sodium: 136 mmol/L (ref 135–145)

## 2019-10-04 LAB — LIPASE, BLOOD: Lipase: 19 U/L (ref 11–51)

## 2019-10-04 MED ORDER — LIDOCAINE VISCOUS HCL 2 % MT SOLN
15.0000 mL | Freq: Once | OROMUCOSAL | Status: AC
  Administered 2019-10-04: 15 mL via ORAL
  Filled 2019-10-04: qty 15

## 2019-10-04 MED ORDER — ALUM & MAG HYDROXIDE-SIMETH 200-200-20 MG/5ML PO SUSP
30.0000 mL | Freq: Once | ORAL | Status: AC
  Administered 2019-10-04: 30 mL via ORAL
  Filled 2019-10-04: qty 30

## 2019-10-04 MED ORDER — PANTOPRAZOLE SODIUM 20 MG PO TBEC: 20.0000 mg | DELAYED_RELEASE_TABLET | Freq: Every day | ORAL | 0 refills | Status: AC

## 2019-10-04 NOTE — ED Triage Notes (Signed)
Patient complaining of chest pain that radiates from her chest to her back. Pain has been ongoing x 1 hour. Patient states that she had been eating and felt like her food got stuck in her throat but states that pain is radiating from her chest to her back currently. Patient states shortness of breath.

## 2019-10-04 NOTE — Discharge Instructions (Addendum)
Follow-up with your doctor next week return if problems

## 2019-10-05 NOTE — ED Provider Notes (Signed)
[facility] EMERGENCY DEPARTMENT Provider Note   CSN: [id number] Arrival date & time: 10/04/19  1942     History Chief Complaint  Patient presents with  . Chest Pain    Danielle Vazquez is a 49 y.o. female.  Patient complains of burning in her chest.  No sweating no shortness of breath  The history is provided by the patient. No language interpreter was used.  Chest Pain Pain location:  Substernal area Pain quality: aching   Pain radiates to:  Does not radiate Pain severity:  Mild Onset quality:  Sudden Timing:  Constant Progression:  Worsening Chronicity:  New Context: not breathing   Relieved by:  Nothing Worsened by:  Nothing Ineffective treatments:  None tried Associated symptoms: no abdominal pain, no back pain, no cough, no fatigue and no headache        Past Medical History:  Diagnosis Date  . Anxiety   . Chronic kidney disease    kdiney stone   . Chronic pain syndrome   . DDD (degenerative disc disease)    has had injections in past  . Degenerative disc disease   . Diverticulosis   . Gastritis   . GERD (gastroesophageal reflux disease)   . Hiatal hernia   . Low back pain   . Migraines   . Numbness and tingling   . Vertigo     Patient Active Problem List   Diagnosis Date Noted  . Pelvic pain 04/04/2019  . Urinary tract infection without hematuria 04/04/2019  . Urinary frequency 04/04/2019  . RUQ pain 09/27/2018  . Loose stools 01/13/2013  . GERD (gastroesophageal reflux disease) 09/03/2011  . Helicobacter pylori ab+ 07/22/2011  . Hematochezia 07/22/2011  . Abdominal pain, other specified site 07/22/2011    Past Surgical History:  Procedure Laterality Date  . CHOLECYSTECTOMY  2009  . COLONOSCOPY WITH PROPOFOL N/A 02/01/2013   YFV:CBSWHQ mucosa in the terminal ileum/Moderate diverticulosis in the sigmoid colon/ONE RECTAL POLYP REMOVED/Small internal hemorrhoids.  Random colon biopsies negative.  Rectal polyps hyperplastic.  Next colonoscopy  in 10 years with MAC  . ESOPHAGOGASTRODUODENOSCOPY  April 2013   SLF: esophageal stricture, gastritis, hiatal hernia, negative H.pylori  . ESOPHAGOGASTRODUODENOSCOPY (EGD) WITH ESOPHAGEAL DILATION N/A 02/01/2013   PRF:[id number] at the gastroesophageal junction/MILD Non-erosive gastritis & MODERATE DUODENITIS.  Duodenal biopsies benign.  No abnormalities.  Stomach showed mild chronic inflammation, no H. pylori.  Esophageal biopsies consistent with GERD.  Marland Kitchen ILEOColonoscopy  09/23/2011   DJT:TSVXBL, multiple in the rectum/Polyps, multiple in the distal transverse colon/Diverticulosis,mild,left sided diverticulosis/Internal hemorrhoids/path: tubular adenomas  . TUBAL LIGATION  1993  . TUBAL LIGATION  1993     OB History    Gravida  3   Para  3   Term  3   Preterm      AB      Living  3     SAB      TAB      Ectopic      Multiple      Live Births  3           Family History  Problem Relation Age of Onset  . Heart attack Mother   . Heart attack Father   . Colon cancer Neg Hx   . Anesthesia problems Neg Hx   . Hypotension Neg Hx   . Malignant hyperthermia Neg Hx   . Pseudochol deficiency Neg Hx     Social History   Tobacco Use  . Smoking status:  Current Every Day Smoker    Packs/day: 0.50    Years: 25.00    Pack years: 12.50    Types: Cigarettes  . Smokeless tobacco: Never Used  Substance Use Topics  . Alcohol use: Not Currently    Comment: rarely  . Drug use: No    Home Medications Prior to Admission medications   Medication Sig Start Date End Date Taking? Authorizing Provider  gabapentin (NEURONTIN) 300 MG capsule Take 300 mg by mouth 3 (three) times daily as needed (for pain).  03/15/18  Yes [provider]  ibuprofen (ADVIL) 800 MG tablet Take 800 mg by mouth every 8 (eight) hours as needed for mild pain or moderate pain.   Yes [provider]  pantoprazole (PROTONIX) 20 MG tablet Take 1 tablet (20 mg total) by mouth daily. 10/04/19    Milton Ferguson, MD    Allergies    Hydrocodone and Codeine  Review of Systems   Review of Systems  Constitutional: Negative for appetite change and fatigue.  HENT: Negative for congestion, ear discharge and sinus pressure.   Eyes: Negative for discharge.  Respiratory: Negative for cough.   Cardiovascular: Positive for chest pain.  Gastrointestinal: Negative for abdominal pain and diarrhea.  Genitourinary: Negative for frequency and hematuria.  Musculoskeletal: Negative for back pain.  Skin: Negative for rash.  Neurological: Negative for seizures and headaches.  Psychiatric/Behavioral: Negative for hallucinations.    Physical Exam Updated Vital Signs BP 106/80   Pulse 65   Temp 98 F (36.7 C) (Oral)   Resp 11   Ht _0  (1.676 m)   Wt 89.8 kg   LMP 09/09/2016 Comment: neg preg  SpO2 97%   BMI 31.96 kg/m   Physical Exam Vitals reviewed.  Constitutional:      Appearance: She is well-developed.  HENT:     Head: Normocephalic.     Nose: Nose normal.     Mouth/Throat:     Mouth: Mucous membranes are moist.  Eyes:     General: No scleral icterus.    Conjunctiva/sclera: Conjunctivae normal.  Neck:     Thyroid: No thyromegaly.  Cardiovascular:     Rate and Rhythm: Normal rate and regular rhythm.     Heart sounds: No murmur. No friction rub. No gallop.   Pulmonary:     Breath sounds: No stridor. No wheezing or rales.  Chest:     Chest wall: No tenderness.  Abdominal:     General: There is no distension.     Tenderness: There is no abdominal tenderness. There is no rebound.  Musculoskeletal:        General: Normal range of motion.     Cervical back: Neck supple.  Lymphadenopathy:     Cervical: No cervical adenopathy.  Skin:    Findings: No erythema or rash.  Neurological:     Mental Status: She is alert and oriented to person, place, and time.     Motor: No abnormal muscle tone.     Coordination: Coordination normal.  Psychiatric:        Behavior: Behavior  normal.     ED Results / Procedures / Treatments   Labs (all labs ordered are listed, but only abnormal results are displayed) Labs Reviewed  BASIC METABOLIC PANEL - Abnormal; Notable for the following components:      Result Value   Glucose, Bld 132 (*)    Calcium 8.5 (*)    All other components within normal limits  CBC  HEPATIC  FUNCTION PANEL  LIPASE, BLOOD  TROPONIN I (HIGH SENSITIVITY)  TROPONIN I (HIGH SENSITIVITY)    EKG EKG Interpretation  Date/Time:  Tuesday October 04 2019 19:55:02 EDT Ventricular Rate:  91 PR Interval:    QRS Duration: 96 QT Interval:  365 QTC Calculation: 450 R Axis:   43 Text Interpretation: Sinus rhythm Confirmed by Milton Ferguson [phone]) on 10/04/2019 8:42:44 PM   Radiology DG Chest Portable 1 View  Result Date: 10/04/2019 CLINICAL DATA:  Chest pain EXAM: PORTABLE CHEST 1 VIEW COMPARISON:  12/02/2013 FINDINGS: The heart size and mediastinal contours are within normal limits. Both lungs are clear. The visualized skeletal structures are unremarkable. IMPRESSION: No active disease. Electronically Signed   By: Donavan Foil M.D.   On: 10/04/2019 20:30    Procedures Procedures (including critical care time)  Medications Ordered in ED Medications  alum & mag hydroxide-simeth (MAALOX/MYLANTA) 200-200-20 MG/5ML suspension 30 mL (30 mLs Oral Given 10/04/19 2057)    And  lidocaine (XYLOCAINE) 2 % viscous mouth solution 15 mL (15 mLs Oral Given 10/04/19 2057)    ED Course  I have reviewed the triage vital signs and the nursing notes.  Pertinent labs & imaging results that were available during my care of the patient were reviewed by me and considered in my medical decision making (see chart for details).    MDM Rules/Calculators/A&P                     Labs unremarkable.  Patient had a GI cocktail that helped.  Treatment will be Protonix for gastritis    This patient presents to the ED for concern of chest pain, this involves an extensive  number of treatment options, and is a complaint that carries with it a high risk of complications and morbidity.  The differential diagnosis includes MI peptic ulcer disease   Lab Tests:   I Ordered, reviewed, and interpreted labs, which included CBC chemistries troponins all unremarkable  Medicines ordered:   I ordered medication GI cocktail with helped her pain  Imaging Studies ordered:   I ordered imaging studies which included chest x-ray    I independently visualized and interpreted imaging which showed no acute disease  Additional history obtained:   Additional history obtained from record  Previous records obtained and reviewed   Consultations Obtained:   Reevaluation:  After the interventions stated above, I reevaluated the patient and found improved with GI cocktail  Critical Interventions:  .   Final Clinical Impression(s) / ED Diagnoses Final diagnoses:  Esophagitis    Rx / DC Orders ED Discharge Orders         Ordered    pantoprazole (PROTONIX) 20 MG tablet  Daily     10/04/19 2244           Milton Ferguson, MD 10/05/19 1053

## 2020-06-05 IMAGING — CT CT RENAL STONE PROTOCOL
2 of 4 series · 15 of 46 positions shown, 17 images · non-contrast
Comparison: August 26, 2018

CLINICAL DATA: Left flank pain with nausea and vomiting

EXAM:
CT ABDOMEN AND PELVIS WITHOUT CONTRAST
TECHNIQUE: Multidetector CT imaging of the abdomen and pelvis was performed
following the standard protocol without IV contrast. Oral contrast
was administered.

[Series 2: axial st · axial · 0.69mm/px · z∈[+962,+1382]mm · 12 of 94 slices shown, 14 images]
[im 5/94  soft-tissue]
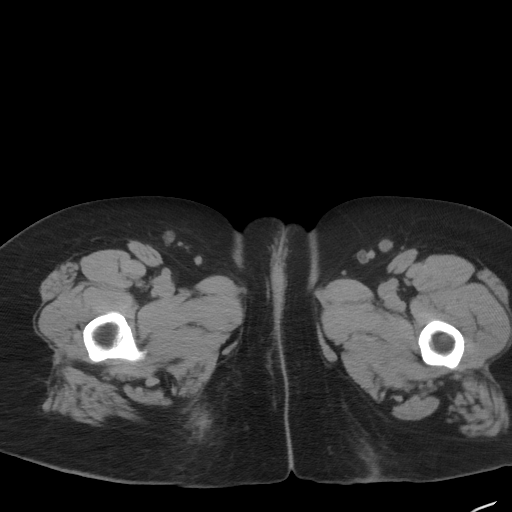
[im 5/94  bone]
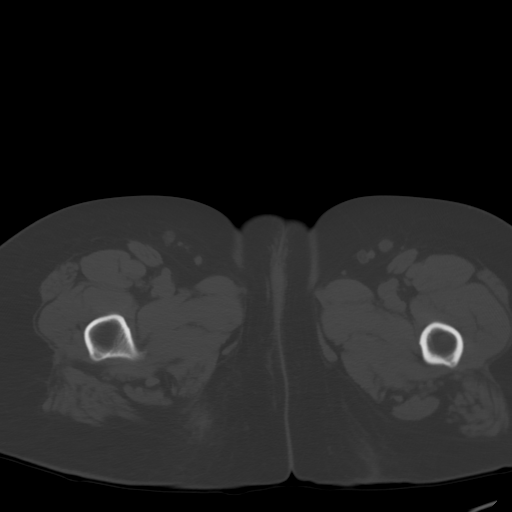
[im 14/94  soft-tissue]
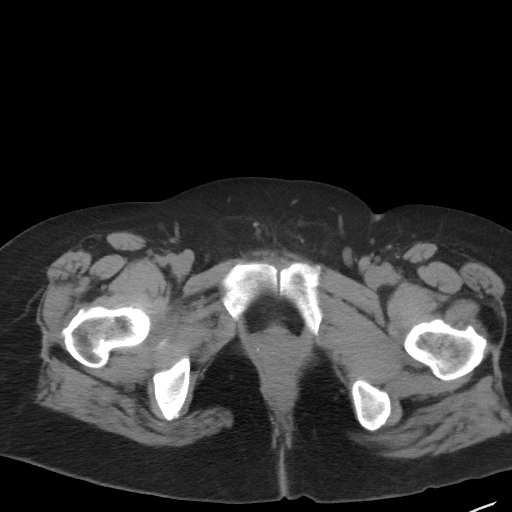
[im 23/94  soft-tissue]
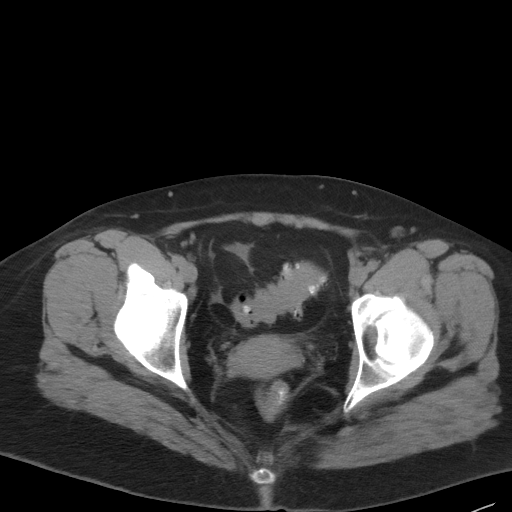
[im 27/94  soft-tissue]
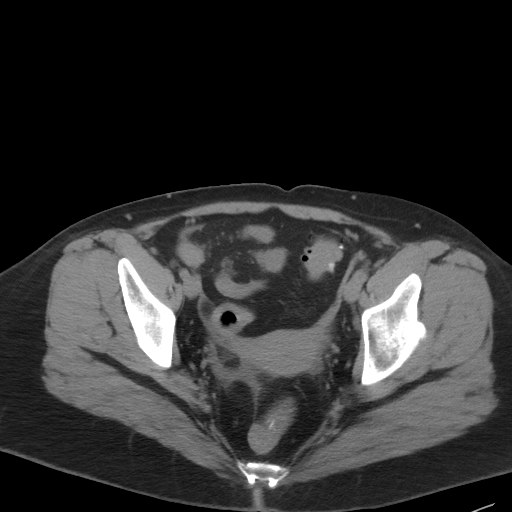
[im 36/94  soft-tissue]
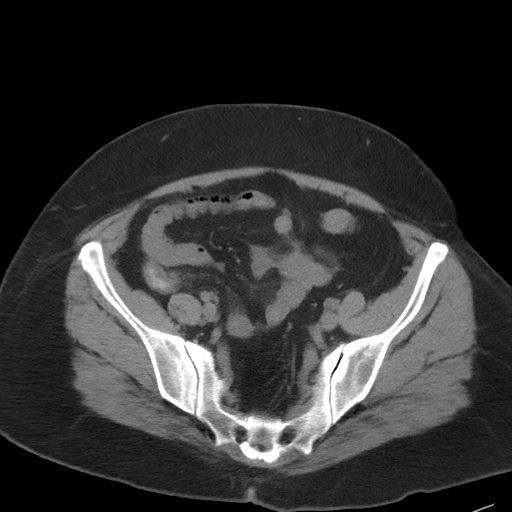
[im 45/94  soft-tissue]
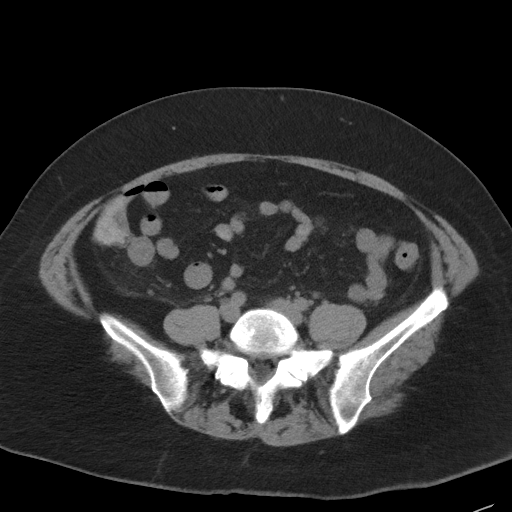
[im 49/94  soft-tissue]
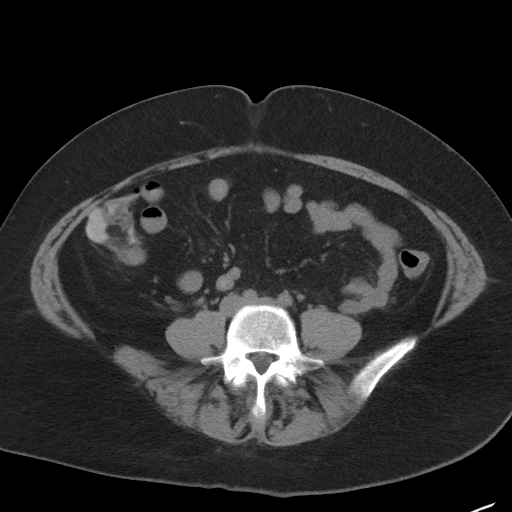
[im 58/94  soft-tissue]
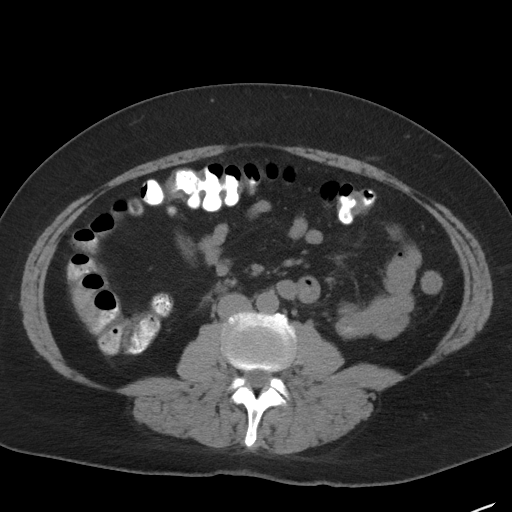
[im 67/94  soft-tissue]
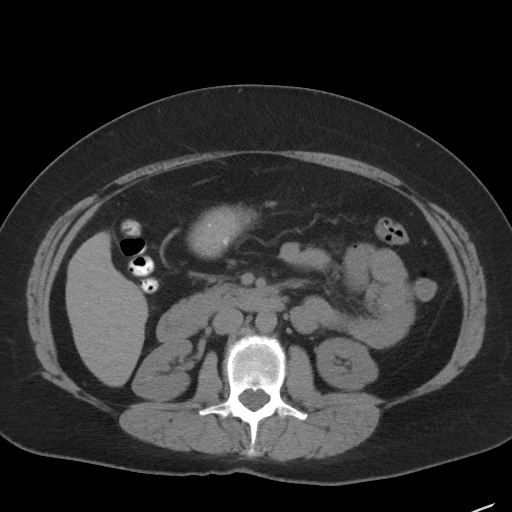
[im 67/94  bone]
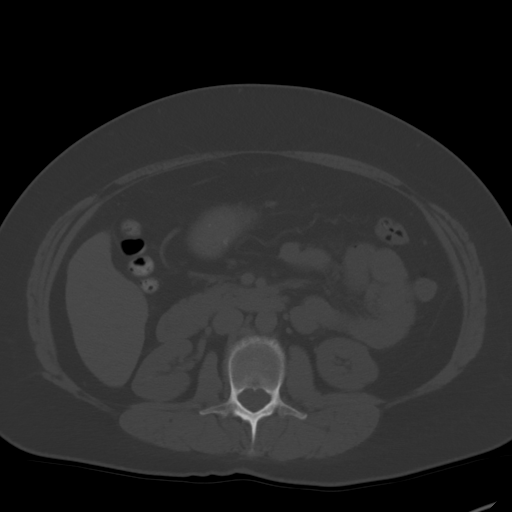
[im 71/94  soft-tissue]
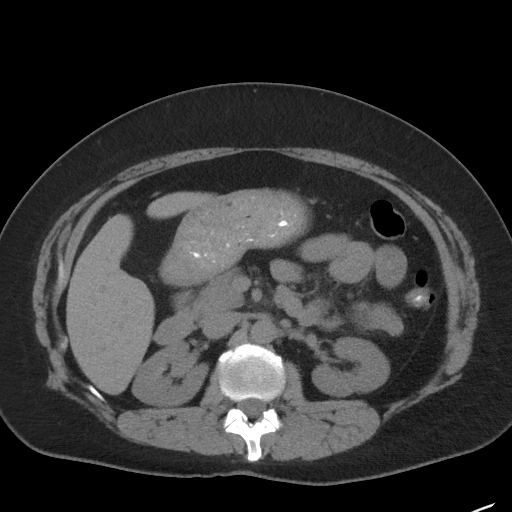
[im 80/94  soft-tissue]
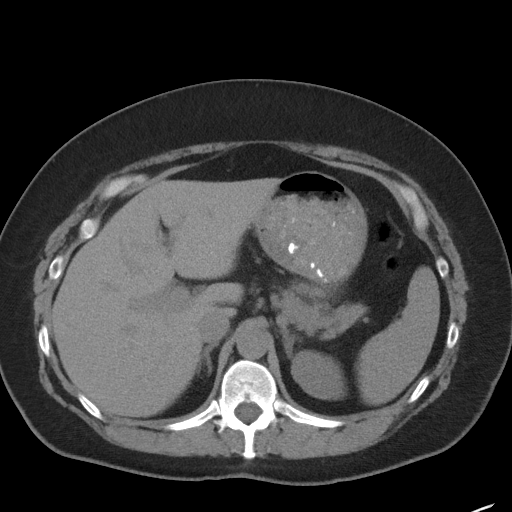
[im 89/94  soft-tissue]
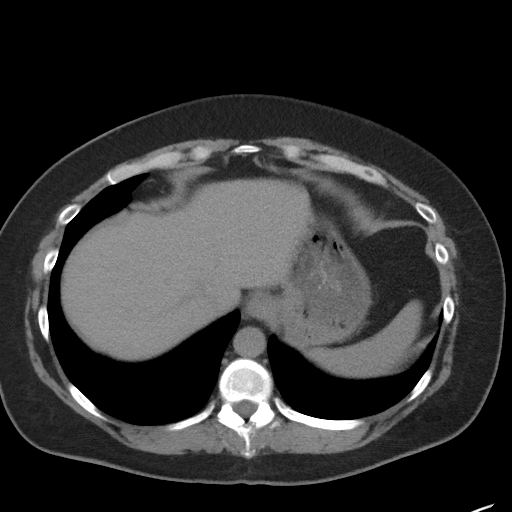

[Series 5: coronal st · coronal · 0.79mm/px · 3 of 101 slices shown]
[im 34/101  soft-tissue]
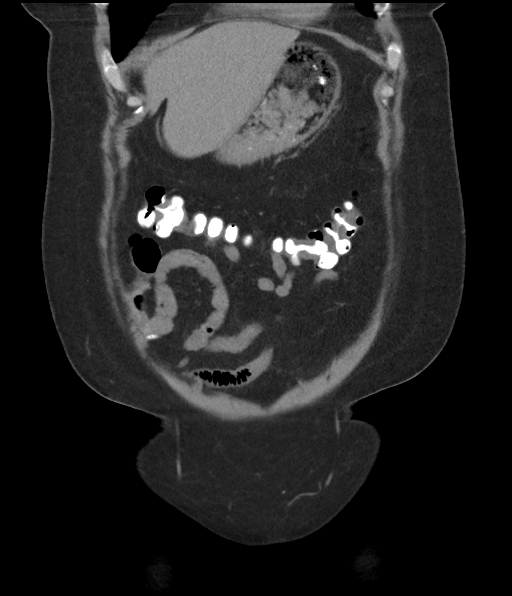
[im 45/101  soft-tissue]
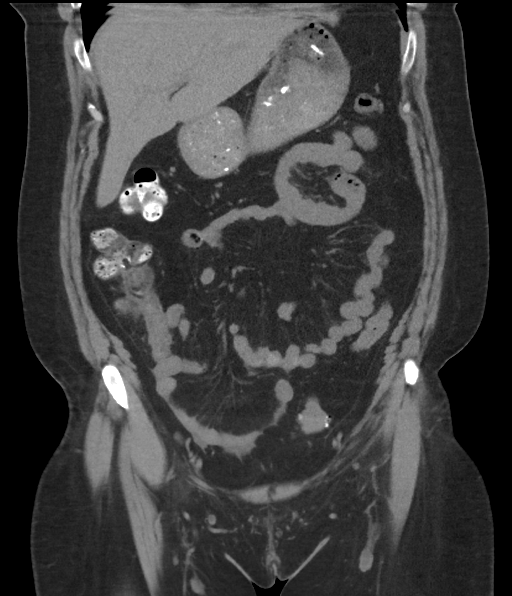
[im 56/101  soft-tissue]
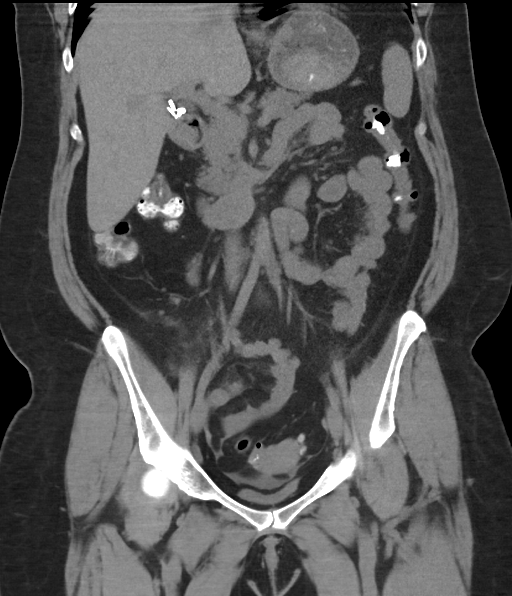

[15 of 46 positions shown; findings below may reference images not displayed]

FINDINGS: Lower chest: Visualized lung bases are clear.

Hepatobiliary: A portion of the dome of the liver is not visualized
on this study. Visualized liver appears unremarkable on this
noncontrast enhanced study. Gallbladder is absent. There is no
appreciable biliary duct dilatation.

Pancreas: There is no pancreatic mass or inflammatory focus.

Spleen: No splenic lesions are evident.

Adrenals/Urinary Tract: Right adrenal appears normal. There is a 6 x
7 mm adenoma in the left adrenal, stable. Kidneys bilaterally show
no evident mass or hydronephrosis on either side. There is slight
nephrocalcinosis bilaterally. There is a 2 mm nonobstructing
calculus in the lower pole of the right kidney. There is no
appreciable ureteral calculus on either side. Urinary bladder is
midline with wall thickness within normal limits.

Stomach/Bowel: There are multiple sigmoid and descending colonic
diverticula without diverticulitis. There is no appreciable bowel
wall or mesenteric thickening. There is no evident bowel
obstruction. The terminal ileum appears unremarkable. There is
lipomatous infiltration in the ileocecal valve. There is no free air
or portal venous air.

Vascular/Lymphatic: There is no abdominal aortic aneurysm. No
vascular calcification evident on this study. No adenopathy is
appreciable in the abdomen or pelvis.

Reproductive: Uterus is midline. There is no extrauterine pelvic
mass. There is loculated appearing fluid posterior to the uterus on
the right in the cul-de-sac region. A small amount of similar
appearing fluid is noted slightly lateral and anterior to the right
adnexa.

Other: There is an appendicolith in the proximal appendix. The
appendix is normal in size and contour. There is no
appendiceal/periappendiceal region inflammation. The appendix is in
the anterior upper right pelvis.

There is no ascites or free fluid in the abdomen or pelvis. There is
a small ventral hernia containing fat but no bowel.

Musculoskeletal: No blastic or lytic bone lesions. No intramuscular
lesions evident.
IMPRESSION: 1. Loculated fluid in the region of the right adnexa tracking
posteriorly into the rightward aspect of the cul-de-sac and more
anteriorly in the right pelvic region. Suspect recent ovarian cyst
rupture with likely hemorrhage given the loculation of this fairly
small amount of fluid. There is mild nearby mesenteric thickening
which may be due to inflammation from this apparent hemorrhagic
event.

2. Appendix normal in size and contour. No periappendiceal region
inflammation. And appendicolith is noted in the proximal appendix.

3. Multiple left-sided colonic diverticula without diverticulitis.
No bowel obstruction. No abscess in the abdomen or pelvis.

4. 2 mm nonobstructing calculus in lower pole of right kidney. No
hydronephrosis or ureteral calculus on either side. Rather minimal
nephrocalcinosis bilaterally.

5.  Stable subcentimeter benign left adrenal adenoma.

6.  Absent gallbladder.

## 2020-11-01 ENCOUNTER — Emergency Department (HOSPITAL_COMMUNITY): Payer: BC Managed Care – PPO

## 2020-11-01 ENCOUNTER — Other Ambulatory Visit: Payer: Self-pay

## 2020-11-01 ENCOUNTER — Encounter (HOSPITAL_COMMUNITY): Payer: Self-pay

## 2020-11-01 ENCOUNTER — Emergency Department (HOSPITAL_COMMUNITY)
Admission: EM | Admit: 2020-11-01 | Discharge: 2020-11-01 | Discharge status: Against Medical Advice | Payer: BC Managed Care – PPO | Attending: Emergency Medicine | Admitting: Emergency Medicine

## 2020-11-01 DIAGNOSIS — N189 Chronic kidney disease, unspecified: Secondary | ICD-10-CM | POA: Diagnosis not present

## 2020-11-01 DIAGNOSIS — R0789 Other chest pain: Secondary | ICD-10-CM

## 2020-11-01 DIAGNOSIS — R072 Precordial pain: Secondary | ICD-10-CM | POA: Diagnosis not present

## 2020-11-01 DIAGNOSIS — F17210 Nicotine dependence, cigarettes, uncomplicated: Secondary | ICD-10-CM | POA: Diagnosis not present

## 2020-11-01 LAB — CBC
HCT: 38.9 % (ref 36.0–46.0)
Hemoglobin: 12.8 g/dL (ref 12.0–15.0)
MCH: 30.3 pg (ref 26.0–34.0)
MCHC: 32.9 g/dL (ref 30.0–36.0)
MCV: 92.2 fL (ref 80.0–100.0)
Platelets: 253 10*3/uL (ref 150–400)
RBC: 4.22 MIL/uL (ref 3.87–5.11)
RDW: 13.1 % (ref 11.5–15.5)
WBC: 7 10*3/uL (ref 4.0–10.5)
nRBC: 0 % (ref 0.0–0.2)

## 2020-11-01 LAB — TROPONIN I (HIGH SENSITIVITY)
Troponin I (High Sensitivity): 3 ng/L (ref <18)
Troponin I (High Sensitivity): 2 ng/L (ref <18)

## 2020-11-01 LAB — COMPREHENSIVE METABOLIC PANEL
ALT: 19 U/L (ref 0–44)
AST: 20 U/L (ref 15–41)
Albumin: 3.6 g/dL (ref 3.5–5.0)
Alkaline Phosphatase: 84 U/L (ref 38–126)
Anion gap: 6 (ref 5–15)
BUN: 10 mg/dL (ref 6–20)
CO2: 24 mmol/L (ref 22–32)
Calcium: 8.6 mg/dL — ABNORMAL LOW (ref 8.9–10.3)
Chloride: 107 mmol/L (ref 98–111)
Creatinine, Ser: 0.93 mg/dL (ref 0.44–1.00)
GFR, Estimated: >60 mL/min (ref >60)
Glucose, Bld: 135 mg/dL — ABNORMAL HIGH (ref 70–99)
Potassium: 3.4 mmol/L — ABNORMAL LOW (ref 3.5–5.1)
Sodium: 137 mmol/L (ref 135–145)
Total Bilirubin: 0.6 mg/dL (ref 0.3–1.2)
Total Protein: 6.5 g/dL (ref 6.5–8.1)

## 2020-11-01 LAB — CBG MONITORING, ED: Glucose-Capillary: 122 mg/dL — ABNORMAL HIGH (ref 70–99)

## 2020-11-01 NOTE — ED Notes (Signed)
Pt wanting to leave and not wait on second troponin to result. RN told pt she would inform EDP to come speak to her.

## 2020-11-01 NOTE — ED Provider Notes (Signed)
[facility] EMERGENCY DEPARTMENT Provider Note   CSN: [id number] Arrival date & time: 11/01/20  1620     History Chief Complaint  Patient presents with  . Chest Pain    Danielle Vazquez is a 50 y.o. female.  HPI   50 year old female complaining of headache chest pain dizziness, this all started at around 11:45 AM today approximately 4-1/2 hours prior to arrival.  She checked into work this morning at 8:00 in the morning, she was in her normal state of health when she developed an acute left-sided headache, frontal, nonradiating and not associated with nausea or vomiting.  She went and took some headache medicine and went to sit down for lunch when she developed worsening pain in her left chest, she became very sweaty and dizzy, this lasted for 20 to 25 minutes and then completely resolved.  She had been noted to be hypertensive at 175/122 by the nurse at her work, she arrives by private vehicle for evaluation of the chest discomfort.  Since that time the pain is completely resolved and she is totally symptom-free at this time there is no headache, no sweatiness, no chest pain, no shortness of breath.  The patient states that she does smoke cigarettes and in fact was smoking cigarettes on the way to the hospital.  She does not drink alcohol and does not use drugs, denies cancer, swelling of the legs, history of pulmonary embolism and is low risk for blood clot.  She denies any recent changes in medications and the only medications that she does take is cyclobenzaprine, gabapentin and oxycodone which she takes for her chronic back pain due to degenerative disc disease.  Past Medical History:  Diagnosis Date  . Anxiety   . Chronic kidney disease    kdiney stone   . Chronic pain syndrome   . DDD (degenerative disc disease)    has had injections in past  . Degenerative disc disease   . Diverticulosis   . Gastritis   . GERD (gastroesophageal reflux disease)   . Hiatal hernia   . Low back  pain   . Migraines   . Numbness and tingling   . Vertigo     Patient Active Problem List   Diagnosis Date Noted  . Pelvic pain 04/04/2019  . Urinary tract infection without hematuria 04/04/2019  . Urinary frequency 04/04/2019  . RUQ pain 09/27/2018  . Loose stools 01/13/2013  . GERD (gastroesophageal reflux disease) 09/03/2011  . Helicobacter pylori ab+ 07/22/2011  . Hematochezia 07/22/2011  . Abdominal pain, other specified site 07/22/2011    Past Surgical History:  Procedure Laterality Date  . CHOLECYSTECTOMY  2009  . COLONOSCOPY WITH PROPOFOL N/A 02/01/2013   EGB:TDVVOH mucosa in the terminal ileum/Moderate diverticulosis in the sigmoid colon/ONE RECTAL POLYP REMOVED/Small internal hemorrhoids.  Random colon biopsies negative.  Rectal polyps hyperplastic.  Next colonoscopy in 10 years with MAC  . ESOPHAGOGASTRODUODENOSCOPY  April 2013   SLF: esophageal stricture, gastritis, hiatal hernia, negative H.pylori  . ESOPHAGOGASTRODUODENOSCOPY (EGD) WITH ESOPHAGEAL DILATION N/A 02/01/2013   YWV:PXTGGYIRS at the gastroesophageal junction/MILD Non-erosive gastritis & MODERATE DUODENITIS.  Duodenal biopsies benign.  No abnormalities.  Stomach showed mild chronic inflammation, no H. pylori.  Esophageal biopsies consistent with GERD.  Marland Kitchen ILEOColonoscopy  09/23/2011   WNI:OEVOJJ, multiple in the rectum/Polyps, multiple in the distal transverse colon/Diverticulosis,mild,left sided diverticulosis/Internal hemorrhoids/path: tubular adenomas  . TUBAL LIGATION  1993  . TUBAL LIGATION  1993     OB History  Gravida  3   Para  3   Term  3   Preterm      AB      Living  3     SAB      IAB      Ectopic      Multiple      Live Births  3           Family History  Problem Relation Age of Onset  . Heart attack Mother   . Heart attack Father   . Colon cancer Neg Hx   . Anesthesia problems Neg Hx   . Hypotension Neg Hx   . Malignant hyperthermia Neg Hx   . Pseudochol  deficiency Neg Hx     Social History   Tobacco Use  . Smoking status: Current Every Day Smoker    Packs/day: 0.50    Years: 25.00    Pack years: 12.50    Types: Cigarettes  . Smokeless tobacco: Never Used  Vaping Use  . Vaping Use: Never used  Substance Use Topics  . Alcohol use: Not Currently    Comment: rarely  . Drug use: No    Home Medications Prior to Admission medications   Medication Sig Start Date End Date Taking? Authorizing Provider  cyclobenzaprine (FLEXERIL) 10 MG tablet Take 1 tablet by mouth 3 (three) times daily as needed. 10/26/20  Yes [provider]  gabapentin (NEURONTIN) 300 MG capsule Take 300 mg by mouth 3 (three) times daily as needed (for pain).  03/15/18  Yes [provider]  ibuprofen (ADVIL) 800 MG tablet Take 800 mg by mouth every 8 (eight) hours as needed for mild pain or moderate pain.   Yes [provider]  Oxycodone HCl 10 MG TABS Take 10 mg by mouth 4 (four) times daily as needed. 10/26/20  Yes [provider]  pantoprazole (PROTONIX) 20 MG tablet Take 1 tablet (20 mg total) by mouth daily. Patient not taking: Reported on 11/01/2020 10/04/19   Milton Ferguson, MD    Allergies    Hydrocodone and Codeine  Review of Systems   Review of Systems  All other systems reviewed and are negative.   Physical Exam Updated Vital Signs BP 132/78   Pulse 63   Temp 98.3 F (36.8 C) (Oral)   Resp 18   Ht 1.651 m (_0 )   Wt 89.8 kg   LMP 09/09/2016 Comment: neg preg  SpO2 100%   BMI 32.95 kg/m   Physical Exam Vitals and nursing note reviewed. Exam conducted with a chaperone present.  Constitutional:      General: She is not in acute distress.    Appearance: She is well-developed. She is not ill-appearing, toxic-appearing or diaphoretic.  HENT:     Head: Normocephalic and atraumatic.     Nose: No congestion or rhinorrhea.     Mouth/Throat:     Mouth: Mucous membranes are moist.     Pharynx: No oropharyngeal  exudate.  Eyes:     General: No scleral icterus.       Right eye: No discharge.        Left eye: No discharge.     Conjunctiva/sclera: Conjunctivae normal.     Pupils: Pupils are equal, round, and reactive to light.  Neck:     Thyroid: No thyromegaly.     Vascular: No JVD.  Cardiovascular:     Rate and Rhythm: Normal rate and regular rhythm.     Heart sounds: Normal  heart sounds. No murmur heard. No friction rub. No gallop.   Pulmonary:     Effort: Pulmonary effort is normal. No respiratory distress.     Breath sounds: Normal breath sounds. No wheezing or rales.  Chest:     Chest wall: No tenderness.  Abdominal:     General: Bowel sounds are normal. There is no distension.     Palpations: Abdomen is soft. There is no mass.     Tenderness: There is no abdominal tenderness.  Musculoskeletal:        General: No tenderness. Normal range of motion.     Cervical back: Normal range of motion and neck supple.     Right lower leg: No edema.     Left lower leg: No edema.  Lymphadenopathy:     Cervical: No cervical adenopathy.  Skin:    General: Skin is warm and dry.     Findings: No erythema or rash.  Neurological:     General: No focal deficit present.     Mental Status: She is alert. Mental status is at baseline.     Cranial Nerves: No cranial nerve deficit.     Motor: No weakness.     Coordination: Coordination normal.     Gait: Gait normal.  Psychiatric:        Mood and Affect: Mood normal.        Behavior: Behavior normal.     ED Results / Procedures / Treatments   Labs (all labs ordered are listed, but only abnormal results are displayed) Labs Reviewed  COMPREHENSIVE METABOLIC PANEL - Abnormal; Notable for the following components:      Result Value   Potassium 3.4 (*)    Glucose, Bld 135 (*)    Calcium 8.6 (*)    All other components within normal limits  CBG MONITORING, ED - Abnormal; Notable for the following components:   Glucose-Capillary 122 (*)    All  other components within normal limits  CBC  TROPONIN I (HIGH SENSITIVITY)  TROPONIN I (HIGH SENSITIVITY)    EKG EKG Interpretation  Date/Time:  Thursday Nov 01 2020 16:31:00 EDT Ventricular Rate:  79 PR Interval:  149 QRS Duration: 103 QT Interval:  402 QTC Calculation: 461 R Axis:   65 Text Interpretation: Sinus rhythm Baseline wander in lead(s) V2 ECG OTHERWISE WITHIN NORMAL LIMITS since last tracing no significant change Confirmed by Noemi Chapel [phone]) on 11/01/2020 4:37:43 PM   Radiology DG Chest Port 1 View  Result Date: 11/01/2020 CLINICAL DATA:  Chest pain and shortness of breath since Monday, smoker EXAM: PORTABLE CHEST 1 VIEW COMPARISON:  Portable exam 1701 hours compared to 10/04/2019 FINDINGS: Normal heart size, mediastinal contours, and pulmonary vascularity. Lungs clear. Minimal central peribronchial thickening noted. No infiltrate, pleural effusion, or pneumothorax. Osseous structures unremarkable. IMPRESSION: Minimal bronchitic changes without infiltrate. Electronically Signed   By: Lavonia Dana M.D.   On: 11/01/2020 17:13    Procedures Procedures   Medications Ordered in ED Medications - No data to display  ED Course  I have reviewed the triage vital signs and the nursing notes.  Pertinent labs & imaging results that were available during my care of the patient were reviewed by me and considered in my medical decision making (see chart for details).    MDM Rules/Calculators/A&P                          This patient's exam was unremarkable, she has  no evidence of abnormalities, her heart and lungs are totally normal, her neurologic exam is totally normal, she has no headache no chest pain and she is not sweaty.  Her EKG is also totally normal at this time without any signs of ischemia.  We will perform a troponin with a delta troponin, she will be cardiac monitored, will give 4 baby aspirin, the patient may need to follow-up in the outpatient setting as she did  have family members that had heart attacks in their late 39s and early 50s though they were also smokers diabetics and had multiple risk factors for coronary disease.  Doubt PE without any tachycardia, no ongoing sx, no sob, no hypoxia and no edema of legs.    This patient is decided to leave [place] prior to the resulting of her last troponin.  She is aware of the indications for return, she understands she is leaving against my advice and that she at risk for severe or life-threatening injuries to her heart or body  Final Clinical Impression(s) / ED Diagnoses Final diagnoses:  Chest pain, mid sternal    Rx / DC Orders ED Discharge Orders    None       Noemi Chapel, MD 11/01/20 2023

## 2020-11-01 NOTE — ED Triage Notes (Signed)
Reports having episode of chest pain after headache. Chest pain has resolved at this time.

## 2020-11-01 NOTE — Discharge Instructions (Signed)
Your testing was normal here No signs of heart attack Your symptoms have resolved ER for worsening symptoms Call the cardiology office for follow up this week - you are at increased risk for heart disease so you should see the heart doctor for possible evaluation with a stress test if they think this is necessary

## 2020-11-01 NOTE — ED Triage Notes (Signed)
Patient reports episode of HTN with headache and diaphoresis that occurred while working. States that episode lasted approx. 25 minutes. Took ASA and felt better. Denies any pain at this time.

## 2020-11-11 ENCOUNTER — Other Ambulatory Visit: Payer: Self-pay

## 2020-11-11 ENCOUNTER — Encounter (HOSPITAL_COMMUNITY): Payer: Self-pay | Admitting: Emergency Medicine

## 2020-11-11 ENCOUNTER — Emergency Department (HOSPITAL_COMMUNITY): Payer: BC Managed Care – PPO

## 2020-11-11 ENCOUNTER — Emergency Department (HOSPITAL_COMMUNITY)
Admission: EM | Admit: 2020-11-11 | Discharge: 2020-11-12 | Disposition: A | Discharge status: Home / Self Care | Payer: BC Managed Care – PPO | Attending: Emergency Medicine | Admitting: Emergency Medicine

## 2020-11-11 DIAGNOSIS — F17210 Nicotine dependence, cigarettes, uncomplicated: Secondary | ICD-10-CM | POA: Insufficient documentation

## 2020-11-11 DIAGNOSIS — Z20822 Contact with and (suspected) exposure to covid-19: Secondary | ICD-10-CM | POA: Insufficient documentation

## 2020-11-11 DIAGNOSIS — N189 Chronic kidney disease, unspecified: Secondary | ICD-10-CM | POA: Diagnosis not present

## 2020-11-11 DIAGNOSIS — J029 Acute pharyngitis, unspecified: Secondary | ICD-10-CM | POA: Diagnosis present

## 2020-11-11 DIAGNOSIS — J020 Streptococcal pharyngitis: Secondary | ICD-10-CM | POA: Diagnosis not present

## 2020-11-11 LAB — RESP PANEL BY RT-PCR (FLU A&B, COVID) ARPGX2
Influenza A by PCR: NEGATIVE
Influenza B by PCR: NEGATIVE
SARS Coronavirus 2 by RT PCR: NEGATIVE

## 2020-11-11 MED ORDER — IBUPROFEN 400 MG PO TABS
400.0000 mg | ORAL_TABLET | Freq: Once | ORAL | Status: AC
  Administered 2020-11-11: 400 mg via ORAL
  Filled 2020-11-11: qty 1

## 2020-11-11 MED ORDER — ALBUTEROL SULFATE HFA 108 (90 BASE) MCG/ACT IN AERS
2.0000 | INHALATION_SPRAY | Freq: Once | RESPIRATORY_TRACT | Status: AC
  Administered 2020-11-11: 2 via RESPIRATORY_TRACT
  Filled 2020-11-11: qty 6.7

## 2020-11-11 NOTE — ED Triage Notes (Signed)
Pt reports sore throat and body aches x 2 days.  

## 2020-11-12 LAB — GROUP A STREP BY PCR: Group A Strep by PCR: DETECTED — AB

## 2020-11-12 MED ORDER — AMOXICILLIN 500 MG PO CAPS
500.0000 mg | ORAL_CAPSULE | Freq: Two times a day (BID) | ORAL | 0 refills | Status: DC
Start: 2020-11-12 — End: 2021-02-22

## 2020-11-12 MED ORDER — AMOXICILLIN 250 MG PO CAPS
500.0000 mg | ORAL_CAPSULE | Freq: Once | ORAL | Status: AC
  Administered 2020-11-12: 500 mg via ORAL
  Filled 2020-11-12: qty 2

## 2020-11-12 NOTE — ED Provider Notes (Signed)
[facility] EMERGENCY DEPARTMENT Provider Note   CSN: [id number] Arrival date & time: 11/11/20  2117     History Chief Complaint  Patient presents with  . Sore Throat    Danielle Vazquez is a 50 y.o. female.  The history is provided by the patient.  Sore Throat This is a new problem. The current episode started 12 to 24 hours ago. The problem occurs daily. The problem has been gradually worsening. Pertinent negatives include no abdominal pain and no shortness of breath. The symptoms are aggravated by swallowing. Nothing relieves the symptoms.   Patient with history of anxiety and chronic pain presents with sore throat and body aches for at least 1 day No fevers or vomiting.  Denies cough.  No shortness of breath.  She reports body aches.  No known sick contacts    Past Medical History:  Diagnosis Date  . Anxiety   . Chronic kidney disease    kdiney stone   . Chronic pain syndrome   . DDD (degenerative disc disease)    has had injections in past  . Degenerative disc disease   . Diverticulosis   . Gastritis   . GERD (gastroesophageal reflux disease)   . Hiatal hernia   . Low back pain   . Migraines   . Numbness and tingling   . Vertigo     Patient Active Problem List   Diagnosis Date Noted  . Pelvic pain 04/04/2019  . Urinary tract infection without hematuria 04/04/2019  . Urinary frequency 04/04/2019  . RUQ pain 09/27/2018  . Loose stools 01/13/2013  . GERD (gastroesophageal reflux disease) 09/03/2011  . Helicobacter pylori ab+ 07/22/2011  . Hematochezia 07/22/2011  . Abdominal pain, other specified site 07/22/2011    Past Surgical History:  Procedure Laterality Date  . CHOLECYSTECTOMY  2009  . COLONOSCOPY WITH PROPOFOL N/A 02/01/2013   DGL:OVFIEP mucosa in the terminal ileum/Moderate diverticulosis in the sigmoid colon/ONE RECTAL POLYP REMOVED/Small internal hemorrhoids.  Random colon biopsies negative.  Rectal polyps hyperplastic.  Next colonoscopy in 10  years with MAC  . ESOPHAGOGASTRODUODENOSCOPY  April 2013   SLF: esophageal stricture, gastritis, hiatal hernia, negative H.pylori  . ESOPHAGOGASTRODUODENOSCOPY (EGD) WITH ESOPHAGEAL DILATION N/A 02/01/2013   PIR:[id number] at the gastroesophageal junction/MILD Non-erosive gastritis & MODERATE DUODENITIS.  Duodenal biopsies benign.  No abnormalities.  Stomach showed mild chronic inflammation, no H. pylori.  Esophageal biopsies consistent with GERD.  Marland Kitchen ILEOColonoscopy  09/23/2011   YTK:ZSWFUX, multiple in the rectum/Polyps, multiple in the distal transverse colon/Diverticulosis,mild,left sided diverticulosis/Internal hemorrhoids/path: tubular adenomas  . TUBAL LIGATION  1993  . TUBAL LIGATION  1993     OB History    Gravida  3   Para  3   Term  3   Preterm      AB      Living  3     SAB      IAB      Ectopic      Multiple      Live Births  3           Family History  Problem Relation Age of Onset  . Heart attack Mother   . Heart attack Father   . Colon cancer Neg Hx   . Anesthesia problems Neg Hx   . Hypotension Neg Hx   . Malignant hyperthermia Neg Hx   . Pseudochol deficiency Neg Hx     Social History   Tobacco Use  . Smoking status: Current Every  Day Smoker    Packs/day: 0.50    Years: 25.00    Pack years: 12.50    Types: Cigarettes  . Smokeless tobacco: Never Used  Vaping Use  . Vaping Use: Never used  Substance Use Topics  . Alcohol use: Not Currently    Comment: rarely  . Drug use: No    Home Medications Prior to Admission medications   Medication Sig Start Date End Date Taking? Authorizing Provider  cyclobenzaprine (FLEXERIL) 10 MG tablet Take 1 tablet by mouth 3 (three) times daily as needed. 10/26/20   [provider]  gabapentin (NEURONTIN) 300 MG capsule Take 300 mg by mouth 3 (three) times daily as needed (for pain).  03/15/18   [provider]  ibuprofen (ADVIL) 800 MG tablet Take 800 mg by mouth every 8 (eight) hours as  needed for mild pain or moderate pain.    [provider]  Oxycodone HCl 10 MG TABS Take 10 mg by mouth 4 (four) times daily as needed. 10/26/20   [provider]  pantoprazole (PROTONIX) 20 MG tablet Take 1 tablet (20 mg total) by mouth daily. Patient not taking: Reported on 11/01/2020 10/04/19   Milton Ferguson, MD    Allergies    Hydrocodone and Codeine  Review of Systems   Review of Systems  Constitutional: Negative for fever.  HENT: Positive for sore throat.   Respiratory: Negative for shortness of breath.   Gastrointestinal: Negative for abdominal pain and vomiting.  Musculoskeletal: Positive for myalgias.  All other systems reviewed and are negative.   Physical Exam Updated Vital Signs BP 100/79 (BP Location: Right Arm)   Pulse 99   Temp 98.9 F (37.2 C) (Oral)   Resp (!) 21   Wt 89.8 kg   LMP 09/09/2016 Comment: neg preg  SpO2 97%   BMI 32.95 kg/m   Physical Exam CONSTITUTIONAL: No distress, appears older than stated age HEAD: Normocephalic/atraumatic EYES: EOMI/PERRL ENMT: Mucous membranes moist, uvula midline with erythema but no exudates.  No angioedema.  No drooling or stridor.  No dysphonia NECK: supple no meningeal signs SPINE/BACK:entire spine nontender CV: S1/S2 noted, no murmurs/rubs/gallops noted LUNGS: Crackles noted in the bases, no wheezing noted, no distress ABDOMEN: soft, nontender NEURO: Pt is awake/alert/appropriate, moves all extremitiesx4.  No facial droop.   EXTREMITIES:full ROM SKIN: warm, color normal PSYCH: no abnormalities of mood noted, alert and oriented to situation  ED Results / Procedures / Treatments   Labs (all labs ordered are listed, but only abnormal results are displayed) Labs Reviewed  GROUP A STREP BY PCR - Abnormal; Notable for the following components:      Result Value   Group A Strep by PCR DETECTED (*)    All other components within normal limits  RESP PANEL BY RT-PCR (FLU A&B, COVID) [id number]     EKG None  Radiology DG Chest Port 1 View  Result Date: 11/12/2020 CLINICAL DATA:  Sore throat and body aches. EXAM: PORTABLE CHEST 1 VIEW COMPARISON:  Nov 01, 2020 FINDINGS: Mild, stable diffusely increased lung markings are seen bilaterally. A very mild amount of atelectasis is seen within the bilateral lung bases. There is no evidence of a pleural effusion or pneumothorax. The heart size and mediastinal contours are within normal limits. The visualized skeletal structures are unremarkable. IMPRESSION: Stable findings suggestive of very mild bronchitis, without acute infiltrate. Electronically Signed   By: Virgina Norfolk M.D.   On: 11/12/2020 00:12    Procedures Procedures   Medications  Ordered in ED Medications  amoxicillin (AMOXIL) capsule 500 mg (has no administration in time range)  albuterol (VENTOLIN HFA) 108 (90 Base) MCG/ACT inhaler 2 puff (2 puffs Inhalation Given 11/11/20 2339)  ibuprofen (ADVIL) tablet 400 mg (400 mg Oral Given 11/11/20 2339)    ED Course  I have reviewed the triage vital signs and the nursing notes.  Pertinent labs & imaging results that were available during my care of the patient were reviewed by me and considered in my medical decision making (see chart for details).    MDM Rules/Calculators/A&P                          12:11 AM Patient reports body aches and sore throat for over a day.  She is in no acute distress.  She did have abnormal lung sounds, therefore ordered chest x-ray and given albuterol as she is a smoker 12:29 AM Chest x-ray reviewed no acute findings Patient appears to have strep throat.  Will start amoxicillin. Patient overall appears well, handling secretions and no acute distress Final Clinical Impression(s) / ED Diagnoses Final diagnoses:  Strep throat  Strep pharyngitis    Rx / DC Orders ED Discharge Orders         Ordered    amoxicillin (AMOXIL) 500 MG capsule  2 times daily        11/12/20 0021            Ripley Fraise, MD 11/12/20 [phone]

## 2020-12-15 IMAGING — DX DG CHEST 1V PORT
1 series · 1 of 1 positions shown · non-contrast
Comparison: 12/02/2013

CLINICAL DATA: Chest pain

EXAM:
PORTABLE CHEST 1 VIEW

[chest ap grid]
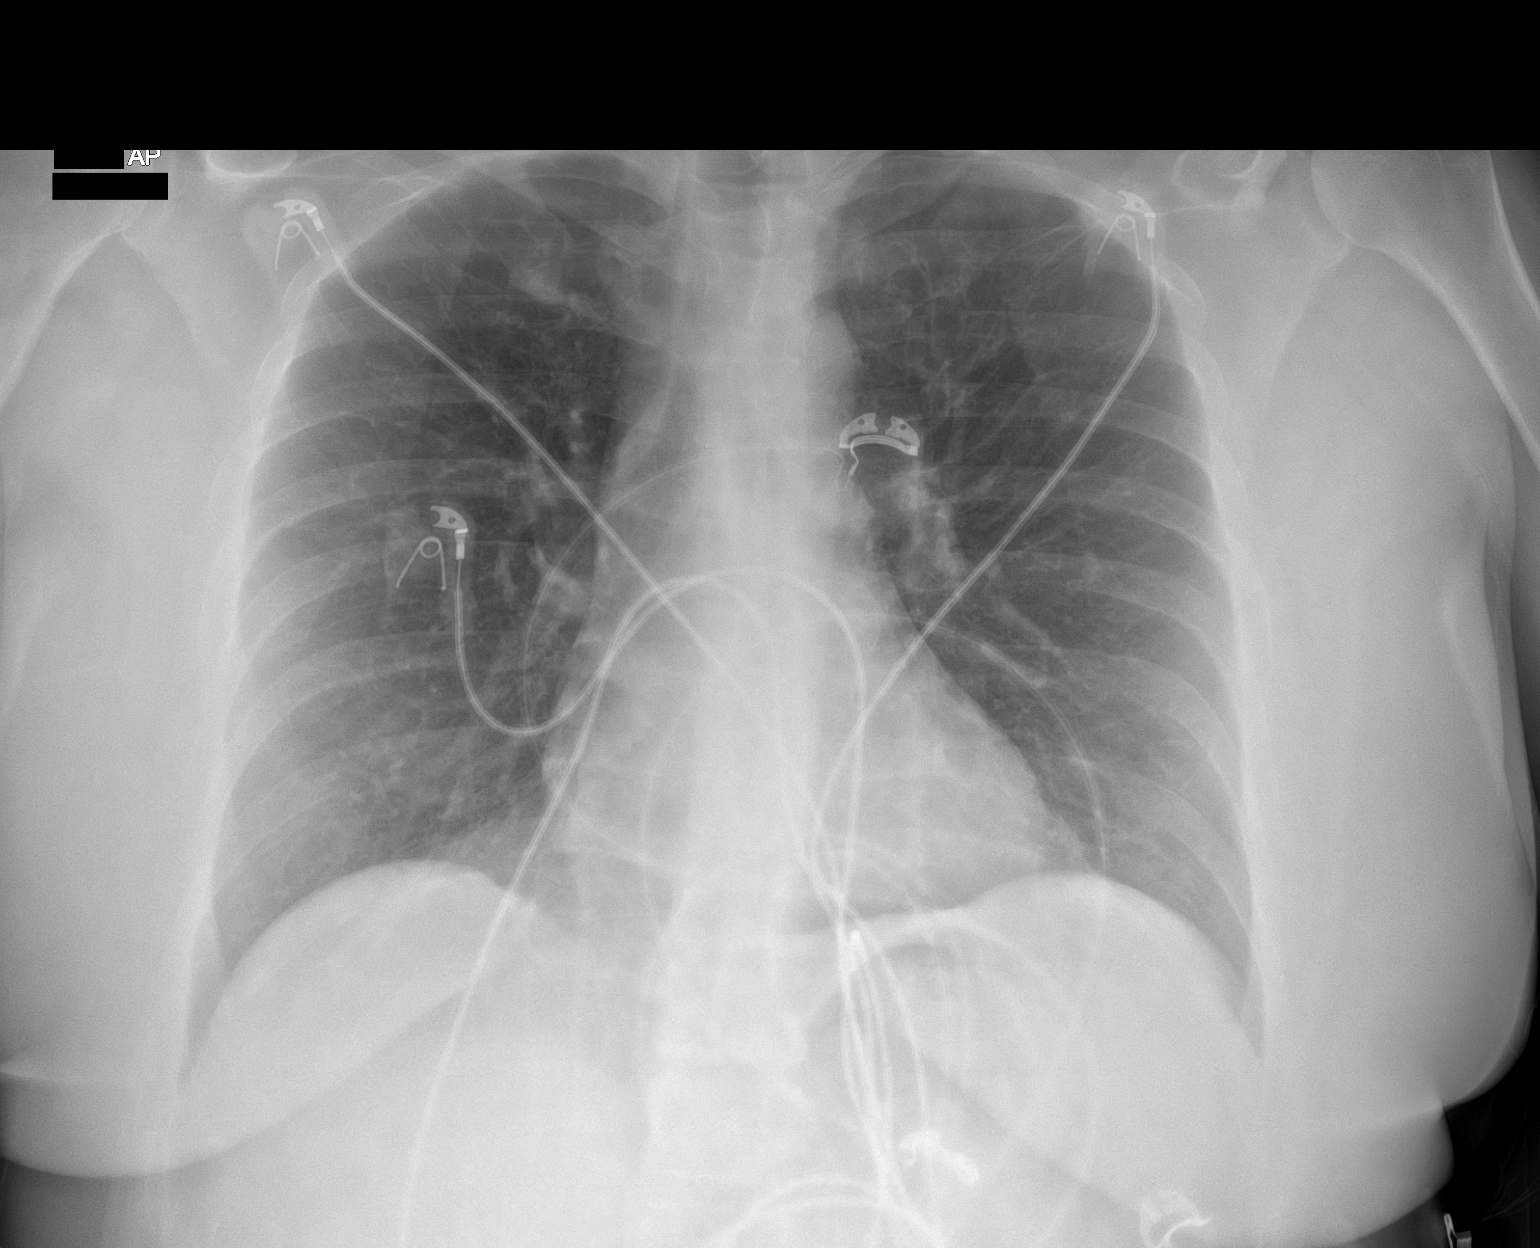

[1 of 1 positions shown; findings below may reference images not displayed]

FINDINGS: The heart size and mediastinal contours are within normal limits.
Both lungs are clear. The visualized skeletal structures are
unremarkable.
IMPRESSION: No active disease.

## 2021-02-22 ENCOUNTER — Encounter: Payer: Self-pay | Admitting: Emergency Medicine

## 2021-02-22 ENCOUNTER — Other Ambulatory Visit: Payer: Self-pay

## 2021-02-22 ENCOUNTER — Ambulatory Visit
Admission: EM | Admit: 2021-02-22 | Discharge: 2021-02-22 | Disposition: A | Discharge status: Home / Self Care | Payer: BC Managed Care – PPO | Attending: Emergency Medicine | Admitting: Emergency Medicine

## 2021-02-22 DIAGNOSIS — R519 Headache, unspecified: Secondary | ICD-10-CM

## 2021-02-22 MED ORDER — AMOXICILLIN-POT CLAVULANATE 875-125 MG PO TABS: 1.0000 | ORAL_TABLET | Freq: Two times a day (BID) | ORAL | 0 refills | Status: AC

## 2021-02-22 NOTE — Discharge Instructions (Addendum)
Augmentin prescribed Follow up with PCP Follow up with dentist as soon as possible for further evaluation and treatment  Return or go to the ED if you have any new or worsening symptoms such as fever, chills, difficulty swallowing, painful swallowing, oral or neck swelling, nausea, vomiting, chest pain, SOB, etc..Marland Kitchen

## 2021-02-22 NOTE — ED Triage Notes (Signed)
Pain to LT cheek , mouth and LT side of nose.  Pt reports having this problem in the past and got better after getting abx.

## 2021-02-22 NOTE — ED Provider Notes (Signed)
[facility]   [id number] 02/22/21 Arrival Time: 5701  CC: facial pain  SUBJECTIVE:  DARLINDA Vazquez is a 50 y.o. female who presents with facial pain and swelling x few days.   Denies a precipitating event or trauma.  Localizes pain to LT cheek.  Has tried OTC analgesics without relief.  Sore to the touch.  Does not see a dentist regularly.  Report similar symptoms in the past with infection.  Denies fever, chills, dysphagia, odynophagia, oral or neck swelling, nausea, vomiting, chest pain, SOB.    ROS: As per HPI.  All other pertinent ROS negative.     Past Medical History:  Diagnosis Date   Anxiety    Chronic kidney disease    kdiney stone    Chronic pain syndrome    DDD (degenerative disc disease)    has had injections in past   Degenerative disc disease    Diverticulosis    Gastritis    GERD (gastroesophageal reflux disease)    Hiatal hernia    Low back pain    Migraines    Numbness and tingling    Vertigo    Past Surgical History:  Procedure Laterality Date   CHOLECYSTECTOMY  2009   COLONOSCOPY WITH PROPOFOL N/A 02/01/2013   XBL:TJQZES mucosa in the terminal ileum/Moderate diverticulosis in the sigmoid colon/ONE RECTAL POLYP REMOVED/Small internal hemorrhoids.  Random colon biopsies negative.  Rectal polyps hyperplastic.  Next colonoscopy in 10 years with MAC   ESOPHAGOGASTRODUODENOSCOPY  April 2013   SLF: esophageal stricture, gastritis, hiatal hernia, negative H.pylori   ESOPHAGOGASTRODUODENOSCOPY (EGD) WITH ESOPHAGEAL DILATION N/A 02/01/2013   PQZ:[id number] at the gastroesophageal junction/MILD Non-erosive gastritis & MODERATE DUODENITIS.  Duodenal biopsies benign.  No abnormalities.  Stomach showed mild chronic inflammation, no H. pylori.  Esophageal biopsies consistent with GERD.   ILEOColonoscopy  09/23/2011   FHL:KTGYBW, multiple in the rectum/Polyps, multiple in the distal transverse colon/Diverticulosis,mild,left sided diverticulosis/Internal  hemorrhoids/path: tubular adenomas   TUBAL LIGATION  1993   TUBAL LIGATION  1993   Allergies  Allergen Reactions   Hydrocodone Nausea Only   Codeine Nausea Only and Rash   Latex Rash   No current facility-administered medications on file prior to encounter.   Current Outpatient Medications on File Prior to Encounter  Medication Sig Dispense Refill   gabapentin (NEURONTIN) 300 MG capsule Take 300 mg by mouth 3 (three) times daily as needed (for pain).      ibuprofen (ADVIL) 800 MG tablet Take 800 mg by mouth every 8 (eight) hours as needed for mild pain or moderate pain.     Oxycodone HCl 10 MG TABS Take 10 mg by mouth 4 (four) times daily as needed.     pantoprazole (PROTONIX) 20 MG tablet Take 1 tablet (20 mg total) by mouth daily. (Patient not taking: Reported on 11/01/2020) 30 tablet 0   Social History   Socioeconomic History   Marital status: Single    Spouse name: Not on file   Number of children: 3   Years of education: 11th grade   Highest education level: Not on file  Occupational History   Occupation: manufacturer's associate  Tobacco Use   Smoking status: Every Day    Packs/day: 0.50    Years: 25.00    Pack years: 12.50    Types: Cigarettes   Smokeless tobacco: Never  Vaping Use   Vaping Use: Never used  Substance and Sexual Activity   Alcohol use: Not Currently    Comment: rarely  Drug use: No   Sexual activity: Yes    Birth control/protection: Surgical, Other-see comments, Post-menopausal    Comment: tubal  Other Topics Concern   Not on file  Social History Narrative   Lives with her daughter and grandchildren.   Right-handed.   4-5 cups caffeine per day.   Social Determinants of Health   Financial Resource Strain: Not on file  Food Insecurity: Not on file  Transportation Needs: Not on file  Physical Activity: Not on file  Stress: Not on file  Social Connections: Not on file  Intimate Partner Violence: Not on file   Family History  Problem  Relation Age of Onset   Heart attack Mother    Heart attack Father    Colon cancer Neg Hx    Anesthesia problems Neg Hx    Hypotension Neg Hx    Malignant hyperthermia Neg Hx    Pseudochol deficiency Neg Hx     OBJECTIVE:  Vitals:   02/22/21 1643  BP: (!) 145/91  Pulse: 94  Resp: 18  Temp: 98.7 F (37.1 C)  TempSrc: Oral  SpO2: 97%    General appearance: alert; no distress HENT: normocephalic; atraumatic, mild swelling and TTP over LT cheek; dentition: poor; several missing teeth, few teeth remaining over lower gum with dental caries Neck: supple without LAD Lungs: normal respirations Skin: warm and dry Psychological: alert and cooperative; normal mood and affect  ASSESSMENT & PLAN:  1. Facial pain     Meds ordered this encounter  Medications   amoxicillin-clavulanate (AUGMENTIN) 875-125 MG tablet    Sig: Take 1 tablet by mouth every 12 (twelve) hours for 10 days.    Dispense:  20 tablet    Refill:  0    Order Specific Question:   Supervising Provider    Answer:   Raylene Everts [[id number]]    Augmentin prescribed Follow up with PCP Follow up with dentist as soon as possible for further evaluation and treatment  Return or go to the ED if you have any new or worsening symptoms such as fever, chills, difficulty swallowing, painful swallowing, oral or neck swelling, nausea, vomiting, chest pain, SOB, etc...  Reviewed expectations re: course of current medical issues. Questions answered. Outlined signs and symptoms indicating need for more acute intervention. Patient verbalized understanding. After Visit Summary given.    Lestine Box, PA-C 02/22/21 1648

## 2021-07-28 ENCOUNTER — Other Ambulatory Visit: Payer: Self-pay

## 2021-07-28 ENCOUNTER — Encounter: Payer: Self-pay | Admitting: Emergency Medicine

## 2021-07-28 ENCOUNTER — Ambulatory Visit
Admission: EM | Admit: 2021-07-28 | Discharge: 2021-07-28 | Disposition: A | Discharge status: Home / Self Care | Payer: BC Managed Care – PPO

## 2021-07-28 DIAGNOSIS — R519 Headache, unspecified: Secondary | ICD-10-CM

## 2021-07-28 DIAGNOSIS — K047 Periapical abscess without sinus: Secondary | ICD-10-CM

## 2021-07-28 DIAGNOSIS — K0889 Other specified disorders of teeth and supporting structures: Secondary | ICD-10-CM

## 2021-07-28 DIAGNOSIS — R220 Localized swelling, mass and lump, head: Secondary | ICD-10-CM

## 2021-07-28 MED ORDER — IBUPROFEN 800 MG PO TABS: 800.0000 mg | ORAL_TABLET | Freq: Three times a day (TID) | ORAL | 0 refills | Status: AC

## 2021-07-28 MED ORDER — AMOXICILLIN-POT CLAVULANATE 875-125 MG PO TABS: 1.0000 | ORAL_TABLET | Freq: Two times a day (BID) | ORAL | 0 refills | Status: DC

## 2021-07-28 NOTE — Discharge Instructions (Signed)
Make sure you schedule an appointment with a dentist/dental surgeon as soon as possible.  You may try some of the resources below.    [facility] in [place],  Address: [address], [place], [place] [address] Phone: [phone]  [facility] [phone] extension [phone]  Dr. Civils [phone] [address]  [facility] [phone] [address]  [facility] [phone] extension [phone] [place], [place], [address] First come first serve for the first 10 clients.  May do simple extractions only, no wisdom teeth or surgery.  You may try the second for Thursday of the month starting at 6:30 AM.  [employer] You may call the school to see if they are still helping to provide dental care for emergent cases.  

## 2021-07-28 NOTE — ED Triage Notes (Signed)
Dental pain on right upper side of mouth.  Right side of face swollen since yesterday.

## 2021-07-28 NOTE — ED Provider Notes (Signed)
[place]   MRN: [id number] DOB: [birth year]  Subjective:   Danielle Vazquez is a 51 y.o. female presenting for 1 day history of acute onset recurrent right sided dental pain, facial pain and swelling. Has a history of poor dentition and now has dental insurance so plans on getting in with them as soon as possible. No fevers, chest pain, diaphoresis, nausea and vomiting. She is having a hard time eating and drinking but is still able to do so. No history of MI, heart disease, CKD.  No current facility-administered medications for this encounter.  Current Outpatient Medications:    atorvastatin (LIPITOR) 10 MG tablet, Take 10 mg by mouth daily., Disp: , Rfl:    zolpidem (AMBIEN) 5 MG tablet, Take 5 mg by mouth at bedtime as needed for sleep., Disp: , Rfl:    gabapentin (NEURONTIN) 300 MG capsule, Take 300 mg by mouth 3 (three) times daily as needed (for pain). , Disp: , Rfl:    ibuprofen (ADVIL) 800 MG tablet, Take 800 mg by mouth every 8 (eight) hours as needed for mild pain or moderate pain., Disp: , Rfl:    Oxycodone HCl 10 MG TABS, Take 10 mg by mouth 4 (four) times daily as needed., Disp: , Rfl:    pantoprazole (PROTONIX) 20 MG tablet, Take 1 tablet (20 mg total) by mouth daily. (Patient not taking: Reported on 11/01/2020), Disp: 30 tablet, Rfl: 0   Allergies  Allergen Reactions   Hydrocodone Nausea Only   Codeine Nausea Only and Rash   Latex Rash    Past Medical History:  Diagnosis Date   Anxiety    Chronic kidney disease    kdiney stone    Chronic pain syndrome    DDD (degenerative disc disease)    has had injections in past   Degenerative disc disease    Diverticulosis    Gastritis    GERD (gastroesophageal reflux disease)    Hiatal hernia    Low back pain    Migraines    Numbness and tingling    Vertigo      Past Surgical History:  Procedure Laterality Date   CHOLECYSTECTOMY  2009   COLONOSCOPY WITH PROPOFOL N/A 02/01/2013   OFB:PZWCHE mucosa in  the terminal ileum/Moderate diverticulosis in the sigmoid colon/ONE RECTAL POLYP REMOVED/Small internal hemorrhoids.  Random colon biopsies negative.  Rectal polyps hyperplastic.  Next colonoscopy in 10 years with MAC   ESOPHAGOGASTRODUODENOSCOPY  April 2013   SLF: esophageal stricture, gastritis, hiatal hernia, negative H.pylori   ESOPHAGOGASTRODUODENOSCOPY (EGD) WITH ESOPHAGEAL DILATION N/A 02/01/2013   NID:[id number] at the gastroesophageal junction/MILD Non-erosive gastritis & MODERATE DUODENITIS.  Duodenal biopsies benign.  No abnormalities.  Stomach showed mild chronic inflammation, no H. pylori.  Esophageal biopsies consistent with GERD.   ILEOColonoscopy  09/23/2011   RWE:RXVQMG, multiple in the rectum/Polyps, multiple in the distal transverse colon/Diverticulosis,mild,left sided diverticulosis/Internal hemorrhoids/path: tubular adenomas   TUBAL LIGATION  1993   TUBAL LIGATION  1993    Family History  Problem Relation Age of Onset   Heart attack Mother    Heart attack Father    Colon cancer Neg Hx    Anesthesia problems Neg Hx    Hypotension Neg Hx    Malignant hyperthermia Neg Hx    Pseudochol deficiency Neg Hx     Social History   Tobacco Use   Smoking status: Every Day    Packs/day: 0.50    Years: 25.00    Pack years: 12.50  Types: Cigarettes   Smokeless tobacco: Never  Vaping Use   Vaping Use: Never used  Substance Use Topics   Alcohol use: Not Currently    Comment: rarely   Drug use: No    ROS   Objective:   Vitals: BP 129/87 (BP Location: Right Arm)    Pulse (!) 113    Temp 98.4 F (36.9 C) (Oral)    Resp 18    LMP 09/09/2016 Comment: neg preg   SpO2 96%   Physical Exam Constitutional:      General: She is not in acute distress.    Appearance: Normal appearance. She is well-developed. She is not ill-appearing, toxic-appearing or diaphoretic.  HENT:     Head: Normocephalic and atraumatic.      Nose: Nose normal.     Mouth/Throat:     Mouth: Mucous  membranes are moist.     Dentition: Abnormal dentition. Dental tenderness and dental caries present.     Comments: Multiple missing teeth. What remains are the front incisors on lower side, one upper incisor. Only has remnants of the anterior molars.  Eyes:     General: No scleral icterus.       Right eye: No discharge.        Left eye: No discharge.     Extraocular Movements: Extraocular movements intact.  Cardiovascular:     Rate and Rhythm: Normal rate.  Pulmonary:     Effort: Pulmonary effort is normal.  Skin:    General: Skin is warm and dry.  Neurological:     General: No focal deficit present.     Mental Status: She is alert and oriented to person, place, and time.  Psychiatric:        Mood and Affect: Mood normal.        Behavior: Behavior normal.    Assessment and Plan :   PDMP not reviewed this encounter.  1. Dental infection   2. Pain, dental   3. Facial pain   4. Facial swelling    Start Augmentin for dental infection/abscess, use ibuprofen for pain and inflammation. Emphasized need for dental surgeon consult and provided her with information for this. Counseled patient on potential for adverse effects with medications prescribed/recommended today, strict ER and return-to-clinic precautions discussed, patient verbalized understanding.   Jaynee Eagles, [place] 07/28/21 [phone]

## 2022-01-04 ENCOUNTER — Ambulatory Visit
Admission: EM | Admit: 2022-01-04 | Discharge: 2022-01-04 | Disposition: A | Discharge status: Home / Self Care | Payer: 59 | Attending: Physician Assistant | Admitting: Physician Assistant

## 2022-01-04 ENCOUNTER — Encounter: Payer: Self-pay | Admitting: Emergency Medicine

## 2022-01-04 ENCOUNTER — Ambulatory Visit (INDEPENDENT_AMBULATORY_CARE_PROVIDER_SITE_OTHER): Payer: 59

## 2022-01-04 DIAGNOSIS — M7989 Other specified soft tissue disorders: Secondary | ICD-10-CM | POA: Diagnosis not present

## 2022-01-04 DIAGNOSIS — R0602 Shortness of breath: Secondary | ICD-10-CM

## 2022-01-04 LAB — POCT URINALYSIS DIP (MANUAL ENTRY)
Bilirubin, UA: NEGATIVE
Blood, UA: NEGATIVE
Glucose, UA: NEGATIVE mg/dL
Ketones, POC UA: NEGATIVE mg/dL
Nitrite, UA: NEGATIVE
Protein Ur, POC: NEGATIVE mg/dL
Spec Grav, UA: 1.01 (ref 1.010–1.025)
Urobilinogen, UA: 0.2 E.U./dL
pH, UA: 6.5 (ref 5.0–8.0)

## 2022-01-04 MED ORDER — FUROSEMIDE 20 MG PO TABS: 20.0000 mg | ORAL_TABLET | Freq: Every day | ORAL | 0 refills | Status: AC

## 2022-01-04 NOTE — Discharge Instructions (Signed)
Your x-ray was normal.  I am concerned that you are retaining fluid.  I have started you on a fluid pill.  Take this in the morning.  Make sure you drink lots of water while on this medication to prevent harm to your kidneys.  I will call you with your lab work if anything is abnormal we need to change your treatment plan.  I do recommend you follow-up with your primary care provider soon as possible.  If you develop any shortness of breath, worsening swelling, heart racing, weakness you need to go to the emergency room.

## 2022-01-04 NOTE — ED Triage Notes (Signed)
Pt here with bilateral lower leg swelling x 3 days with burning in mouth and tiredness.

## 2022-01-04 NOTE — ED Provider Notes (Signed)
[facility]    CSN: [id number] Arrival date & time: 01/04/22  1305      History   Chief Complaint Chief Complaint  Patient presents with   Mouth Pain   Leg Swelling    HPI Danielle Vazquez is a 51 y.o. female.   Patient presents today with a 3-day history of increasing lower extremity edema.  She denies any recent medication changes.  Denies any recent dietary changes or increased sodium consumption.  Reports some discomfort associated with swelling but denies any significant pain.  She denies history of thyroid disorder, chronic liver/kidney disease, heart failure.  She has chronic kidney disease listed on her chart but reports this is related to previous kidney stone.  CMP from 11/01/2020 showed normal kidney function with creatinine of 0.93.  She does have a primary care provider (Dr. Nancy Fetter) but has not seen them recently.  She reports some shortness of breath but this is not significant.  Denies any recent illness including cough or congestion.  Denies previous abdominal surgery or pelvic dissection.  Denies history of lymphedema.  She denies risk factors for DVT including active malignancy, immobilization, exogenous hormone use, recent COVID-19 infection.    Past Medical History:  Diagnosis Date   Anxiety    Chronic kidney disease    kdiney stone    Chronic pain syndrome    DDD (degenerative disc disease)    has had injections in past   Degenerative disc disease    Diverticulosis    Gastritis    GERD (gastroesophageal reflux disease)    Hiatal hernia    Low back pain    Migraines    Numbness and tingling    Vertigo     Patient Active Problem List   Diagnosis Date Noted   Pelvic pain 04/04/2019   Urinary tract infection without hematuria 04/04/2019   Urinary frequency 04/04/2019   RUQ pain 09/27/2018   Loose stools 01/13/2013   GERD (gastroesophageal reflux disease) 95/62/1308   Helicobacter pylori ab+ 07/22/2011   Hematochezia 07/22/2011   Abdominal  pain, other specified site 07/22/2011    Past Surgical History:  Procedure Laterality Date   CHOLECYSTECTOMY  2009   COLONOSCOPY WITH PROPOFOL N/A 02/01/2013   MVH:QIONGE mucosa in the terminal ileum/Moderate diverticulosis in the sigmoid colon/ONE RECTAL POLYP REMOVED/Small internal hemorrhoids.  Random colon biopsies negative.  Rectal polyps hyperplastic.  Next colonoscopy in 10 years with MAC   ESOPHAGOGASTRODUODENOSCOPY  April 2013   SLF: esophageal stricture, gastritis, hiatal hernia, negative H.pylori   ESOPHAGOGASTRODUODENOSCOPY (EGD) WITH ESOPHAGEAL DILATION N/A 02/01/2013   XBM:[id number] at the gastroesophageal junction/MILD Non-erosive gastritis & MODERATE DUODENITIS.  Duodenal biopsies benign.  No abnormalities.  Stomach showed mild chronic inflammation, no H. pylori.  Esophageal biopsies consistent with GERD.   ILEOColonoscopy  09/23/2011   UUV:OZDGUY, multiple in the rectum/Polyps, multiple in the distal transverse colon/Diverticulosis,mild,left sided diverticulosis/Internal hemorrhoids/path: tubular adenomas   TUBAL LIGATION  1993   TUBAL LIGATION  1993    OB History     Gravida  3   Para  3   Term  3   Preterm      AB      Living  3      SAB      IAB      Ectopic      Multiple      Live Births  3            Home Medications    Prior  to Admission medications   Medication Sig Start Date End Date Taking? Authorizing Provider  furosemide (LASIX) 20 MG tablet Take 1 tablet (20 mg total) by mouth daily. 01/04/22  Yes Amaura Authier K, PA-C  atorvastatin (LIPITOR) 10 MG tablet Take 10 mg by mouth daily.    [provider]  gabapentin (NEURONTIN) 300 MG capsule Take 300 mg by mouth 3 (three) times daily as needed (for pain).  03/15/18   [provider]  ibuprofen (ADVIL) 800 MG tablet Take 1 tablet (800 mg total) by mouth 3 (three) times daily. 07/28/21   Jaynee Eagles, PA-C  Oxycodone HCl 10 MG TABS Take 10 mg by mouth 4 (four) times daily as  needed. 10/26/20   [provider]  pantoprazole (PROTONIX) 20 MG tablet Take 1 tablet (20 mg total) by mouth daily. Patient not taking: Reported on 11/01/2020 10/04/19   Milton Ferguson, MD  zolpidem (AMBIEN) 5 MG tablet Take 5 mg by mouth at bedtime as needed for sleep.    [provider]    Family History Family History  Problem Relation Age of Onset   Heart attack Mother    Heart attack Father    Colon cancer Neg Hx    Anesthesia problems Neg Hx    Hypotension Neg Hx    Malignant hyperthermia Neg Hx    Pseudochol deficiency Neg Hx     Social History Social History   Tobacco Use   Smoking status: Every Day    Packs/day: 0.50    Years: 25.00    Total pack years: 12.50    Types: Cigarettes   Smokeless tobacco: Never  Vaping Use   Vaping Use: Never used  Substance Use Topics   Alcohol use: Not Currently    Comment: rarely   Drug use: No     Allergies   Hydrocodone, Codeine, and Latex   Review of Systems Review of Systems  Constitutional:  Positive for activity change. Negative for appetite change, fatigue and fever.  HENT:  Negative for congestion.   Respiratory:  Positive for shortness of breath. Negative for cough.   Cardiovascular:  Positive for leg swelling. Negative for chest pain and palpitations.  Gastrointestinal:  Negative for abdominal pain, diarrhea, nausea and vomiting.  Musculoskeletal:  Positive for myalgias. Negative for arthralgias.     Physical Exam Triage Vital Signs ED Triage Vitals [01/04/22 1336]  Enc Vitals Group     BP (!) 143/93     Pulse Rate 83     Resp 20     Temp 98 F (36.7 C)     Temp src      SpO2 93 %     Weight      Height      Head Circumference      Peak Flow      Pain Score      Pain Loc      Pain Edu?      Excl. in [place]?    No data found.  Updated Vital Signs BP (!) 143/93   Pulse 83   Temp 98 F (36.7 C)   Resp 20   LMP 09/09/2016 Comment: neg preg  SpO2 93%   Visual Acuity Right Eye  Distance:   Left Eye Distance:   Bilateral Distance:    Right Eye Near:   Left Eye Near:    Bilateral Near:     Physical Exam Vitals reviewed.  Constitutional:      General: She is awake.  She is not in acute distress.    Appearance: Normal appearance. She is well-developed. She is not ill-appearing.     Comments: Very pleasant female appears stated age in no acute distress sitting comfortably on exam room table  HENT:     Head: Normocephalic and atraumatic.  Cardiovascular:     Rate and Rhythm: Normal rate and regular rhythm.     Heart sounds: Normal heart sounds, S1 normal and S2 normal. No murmur heard.    Comments: 2+ pitting edema to ankle with nonpitting edema noted to mid anterior tibia Pulmonary:     Effort: Pulmonary effort is normal.     Breath sounds: Normal breath sounds. No wheezing, rhonchi or rales.     Comments: Clear to auscultation bilaterally Abdominal:     General: Bowel sounds are normal.     Palpations: Abdomen is soft.     Tenderness: There is no abdominal tenderness. There is no right CVA tenderness, left CVA tenderness, guarding or rebound.  Musculoskeletal:     Right lower leg: 2+ Edema present.     Left lower leg: 2+ Edema present.  Feet:     Comments: Foot neurovascularly intact.  Capillary refill within 2 seconds bilateral toes. Psychiatric:        Behavior: Behavior is cooperative.      UC Treatments / Results  Labs (all labs ordered are listed, but only abnormal results are displayed) Labs Reviewed  POCT URINALYSIS DIP (MANUAL ENTRY) - Abnormal; Notable for the following components:      Result Value   Leukocytes, UA Trace (*)    All other components within normal limits  CBC WITH DIFFERENTIAL/PLATELET  COMPREHENSIVE METABOLIC PANEL  TSH  T4, FREE    EKG   Radiology DG Chest 2 View  Result Date: 01/04/2022 CLINICAL DATA:  Shortness of breath and bilateral lower extremity swelling for 3 days. Fatigue. EXAM: CHEST - 2 VIEW  COMPARISON:  11/11/2020 FINDINGS: The heart size and mediastinal contours are within normal limits. Both lungs are clear. The visualized skeletal structures are unremarkable. IMPRESSION: No active cardiopulmonary disease. Electronically Signed   By: Marlaine Hind M.D.   On: 01/04/2022 14:34    Procedures Procedures (including critical care time)  Medications Ordered in UC Medications - No data to display  Initial Impression / Assessment and Plan / UC Course  I have reviewed the triage vital signs and the nursing notes.  Pertinent labs & imaging results that were available during my care of the patient were reviewed by me and considered in my medical decision making (see chart for details).     X-ray was obtained given shortness of breath which showed no acute cardiopulmonary disease.  Unclear etiology of swelling.  UA was obtained that showed no significant proteinuria.  CBC, CMP, TSH obtained today-results pending.  Unable to obtain BNP and current clinical setting.  We will empirically treat with low-dose furosemide daily for up to 5 days.  Recommended she avoid salt and make sure she is drinking plenty of fluid.  Discussed the importance of following up closely with her primary care provider for further evaluation and management as she may need additional testing.  Discussed at length that if she has any worsening symptoms including increased shortness of breath, worsening swelling, nausea/vomiting she needs to go to the emergency room immediately to which she expressed understanding.  Strict return precautions given.  Final Clinical Impressions(s) / UC Diagnoses   Final diagnoses:  Leg swelling  Shortness of  breath     Discharge Instructions      Your x-ray was normal.  I am concerned that you are retaining fluid.  I have started you on a fluid pill.  Take this in the morning.  Make sure you drink lots of water while on this medication to prevent harm to your kidneys.  I will call you  with your lab work if anything is abnormal we need to change your treatment plan.  I do recommend you follow-up with your primary care provider soon as possible.  If you develop any shortness of breath, worsening swelling, heart racing, weakness you need to go to the emergency room.     ED Prescriptions     Medication Sig Dispense Auth. Provider   furosemide (LASIX) 20 MG tablet Take 1 tablet (20 mg total) by mouth daily. 5 tablet Iyonnah Ferrante, Derry Skill, PA-C      PDMP not reviewed this encounter.   Terrilee Croak, PA-C 01/04/22 1449

## 2022-01-05 LAB — CBC WITH DIFFERENTIAL/PLATELET
Basophils Absolute: 0.1 10*3/uL (ref 0.0–0.2)
Basos: 1 %
EOS (ABSOLUTE): 0.4 10*3/uL (ref 0.0–0.4)
Eos: 5 %
Hematocrit: 35.9 % (ref 34.0–46.6)
Hemoglobin: 12.4 g/dL (ref 11.1–15.9)
Immature Grans (Abs): 0.1 10*3/uL (ref 0.0–0.1)
Immature Granulocytes: 1 %
Lymphocytes Absolute: 1.9 10*3/uL (ref 0.7–3.1)
Lymphs: 26 %
MCH: 30.8 pg (ref 26.6–33.0)
MCHC: 34.5 g/dL (ref 31.5–35.7)
MCV: 89 fL (ref 79–97)
Monocytes Absolute: 0.6 10*3/uL (ref 0.1–0.9)
Monocytes: 9 %
Neutrophils Absolute: 4.2 10*3/uL (ref 1.4–7.0)
Neutrophils: 58 %
Platelets: 266 10*3/uL (ref 150–450)
RBC: 4.02 x10E6/uL (ref 3.77–5.28)
RDW: 13.1 % (ref 11.7–15.4)
WBC: 7.2 10*3/uL (ref 3.4–10.8)

## 2022-01-05 LAB — COMPREHENSIVE METABOLIC PANEL
ALT: 34 IU/L — ABNORMAL HIGH (ref 0–32)
AST: 35 IU/L (ref 0–40)
Albumin/Globulin Ratio: 1.6 (ref 1.2–2.2)
Albumin: 4.1 g/dL (ref 3.8–4.9)
Alkaline Phosphatase: 126 IU/L — ABNORMAL HIGH (ref 44–121)
BUN/Creatinine Ratio: 12 (ref 9–23)
BUN: 9 mg/dL (ref 6–24)
Bilirubin Total: 0.5 mg/dL (ref 0.0–1.2)
CO2: 21 mmol/L (ref 20–29)
Calcium: 8.6 mg/dL — ABNORMAL LOW (ref 8.7–10.2)
Chloride: 102 mmol/L (ref 96–106)
Creatinine, Ser: 0.76 mg/dL (ref 0.57–1.00)
Globulin, Total: 2.6 g/dL (ref 1.5–4.5)
Glucose: 113 mg/dL — ABNORMAL HIGH (ref 70–99)
Potassium: 4.4 mmol/L (ref 3.5–5.2)
Sodium: 136 mmol/L (ref 134–144)
Total Protein: 6.7 g/dL (ref 6.0–8.5)
eGFR: 95 mL/min/{1.73_m2} (ref >59)

## 2022-01-05 LAB — TSH: TSH: 2.48 u[IU]/mL (ref 0.450–4.500)

## 2022-01-05 LAB — T4, FREE: Free T4: 0.93 ng/dL (ref 0.82–1.77)

## 2022-05-27 DIAGNOSIS — G43909 Migraine, unspecified, not intractable, without status migrainosus: Secondary | ICD-10-CM | POA: Diagnosis not present

## 2022-05-27 DIAGNOSIS — M545 Low back pain, unspecified: Secondary | ICD-10-CM | POA: Diagnosis not present

## 2022-05-27 DIAGNOSIS — G8929 Other chronic pain: Secondary | ICD-10-CM | POA: Diagnosis not present

## 2022-06-27 DIAGNOSIS — M545 Low back pain, unspecified: Secondary | ICD-10-CM | POA: Diagnosis not present

## 2022-06-27 DIAGNOSIS — G8929 Other chronic pain: Secondary | ICD-10-CM | POA: Diagnosis not present

## 2022-06-27 DIAGNOSIS — G43909 Migraine, unspecified, not intractable, without status migrainosus: Secondary | ICD-10-CM | POA: Diagnosis not present

## 2022-07-28 DIAGNOSIS — G8929 Other chronic pain: Secondary | ICD-10-CM | POA: Diagnosis not present

## 2022-07-28 DIAGNOSIS — M545 Low back pain, unspecified: Secondary | ICD-10-CM | POA: Diagnosis not present

## 2022-07-28 DIAGNOSIS — G43909 Migraine, unspecified, not intractable, without status migrainosus: Secondary | ICD-10-CM | POA: Diagnosis not present

## 2022-07-30 ENCOUNTER — Other Ambulatory Visit (HOSPITAL_COMMUNITY): Payer: Self-pay | Admitting: Internal Medicine

## 2022-07-30 DIAGNOSIS — Z780 Asymptomatic menopausal state: Secondary | ICD-10-CM

## 2022-09-15 ENCOUNTER — Inpatient Hospital Stay (HOSPITAL_COMMUNITY): Admission: RE | Admit: 2022-09-15 | Payer: Self-pay | Source: Ambulatory Visit

## 2022-09-18 ENCOUNTER — Other Ambulatory Visit (HOSPITAL_COMMUNITY): Payer: Self-pay

## 2022-12-22 ENCOUNTER — Ambulatory Visit: Payer: Self-pay

## 2022-12-26 ENCOUNTER — Other Ambulatory Visit (HOSPITAL_COMMUNITY): Payer: Self-pay | Admitting: Internal Medicine

## 2022-12-26 DIAGNOSIS — Z87891 Personal history of nicotine dependence: Secondary | ICD-10-CM

## 2022-12-26 DIAGNOSIS — Z122 Encounter for screening for malignant neoplasm of respiratory organs: Secondary | ICD-10-CM

## 2023-01-02 ENCOUNTER — Encounter: Payer: Self-pay | Admitting: *Deleted

## 2023-01-21 ENCOUNTER — Ambulatory Visit (HOSPITAL_COMMUNITY): Payer: 59

## 2023-01-21 ENCOUNTER — Encounter (HOSPITAL_COMMUNITY): Payer: Self-pay

## 2023-02-27 ENCOUNTER — Other Ambulatory Visit (HOSPITAL_COMMUNITY): Payer: Self-pay | Admitting: Internal Medicine

## 2023-02-27 DIAGNOSIS — Z1231 Encounter for screening mammogram for malignant neoplasm of breast: Secondary | ICD-10-CM

## 2023-03-06 ENCOUNTER — Ambulatory Visit (HOSPITAL_COMMUNITY): Payer: 59

## 2023-03-19 ENCOUNTER — Ambulatory Visit (HOSPITAL_COMMUNITY): Payer: Self-pay

## 2023-03-25 ENCOUNTER — Ambulatory Visit (HOSPITAL_COMMUNITY)
Admission: RE | Admit: 2023-03-25 | Discharge: 2023-03-25 | Disposition: A | Discharge status: Home / Self Care | Payer: Medicaid Other | Source: Ambulatory Visit | Attending: Internal Medicine | Admitting: Internal Medicine

## 2023-03-25 DIAGNOSIS — Z1231 Encounter for screening mammogram for malignant neoplasm of breast: Secondary | ICD-10-CM | POA: Diagnosis present

## 2023-05-27 ENCOUNTER — Other Ambulatory Visit (HOSPITAL_COMMUNITY): Payer: Self-pay | Admitting: Internal Medicine

## 2023-05-27 DIAGNOSIS — Z780 Asymptomatic menopausal state: Secondary | ICD-10-CM

## 2023-06-02 ENCOUNTER — Other Ambulatory Visit (HOSPITAL_COMMUNITY): Payer: Medicaid Other

## 2023-06-04 ENCOUNTER — Other Ambulatory Visit (HOSPITAL_COMMUNITY): Payer: Medicaid Other

## 2023-06-05 ENCOUNTER — Other Ambulatory Visit (HOSPITAL_COMMUNITY): Payer: Medicaid Other

## 2023-06-09 ENCOUNTER — Ambulatory Visit (HOSPITAL_COMMUNITY): Payer: Medicaid Other

## 2023-06-09 ENCOUNTER — Encounter (HOSPITAL_COMMUNITY): Payer: Self-pay

## 2023-07-02 ENCOUNTER — Other Ambulatory Visit (HOSPITAL_COMMUNITY): Payer: Medicaid Other

## 2023-09-23 ENCOUNTER — Emergency Department (HOSPITAL_COMMUNITY)
Admission: EM | Admit: 2023-09-23 | Discharge: 2023-09-24 | Disposition: A | Discharge status: Home / Self Care | Attending: Emergency Medicine | Admitting: Emergency Medicine

## 2023-09-23 ENCOUNTER — Encounter (HOSPITAL_COMMUNITY): Payer: Self-pay | Admitting: Emergency Medicine

## 2023-09-23 ENCOUNTER — Emergency Department (HOSPITAL_COMMUNITY)

## 2023-09-23 ENCOUNTER — Other Ambulatory Visit: Payer: Self-pay

## 2023-09-23 DIAGNOSIS — G43909 Migraine, unspecified, not intractable, without status migrainosus: Secondary | ICD-10-CM | POA: Diagnosis not present

## 2023-09-23 DIAGNOSIS — N189 Chronic kidney disease, unspecified: Secondary | ICD-10-CM | POA: Insufficient documentation

## 2023-09-23 DIAGNOSIS — Z91040 Latex allergy status: Secondary | ICD-10-CM | POA: Insufficient documentation

## 2023-09-23 DIAGNOSIS — Z79899 Other long term (current) drug therapy: Secondary | ICD-10-CM | POA: Diagnosis not present

## 2023-09-23 DIAGNOSIS — R519 Headache, unspecified: Secondary | ICD-10-CM | POA: Diagnosis present

## 2023-09-23 MED ORDER — ACETAMINOPHEN 500 MG PO TABS
1000.0000 mg | ORAL_TABLET | Freq: Once | ORAL | Status: AC
  Administered 2023-09-24: 1000 mg via ORAL
  Filled 2023-09-23: qty 2

## 2023-09-23 MED ORDER — METOCLOPRAMIDE HCL 10 MG PO TABS
10.0000 mg | ORAL_TABLET | Freq: Once | ORAL | Status: AC
  Administered 2023-09-24: 10 mg via ORAL
  Filled 2023-09-23: qty 1

## 2023-09-23 MED ORDER — DIPHENHYDRAMINE HCL 25 MG PO CAPS
25.0000 mg | ORAL_CAPSULE | Freq: Once | ORAL | Status: AC
  Administered 2023-09-24: 25 mg via ORAL
  Filled 2023-09-23: qty 1

## 2023-09-23 NOTE — ED Provider Notes (Signed)
 [place] [facility] AT [facility] Provider Note   CSN: [id number] Arrival date & time: 09/23/23  2137     History {Add pertinent medical, surgical, social history, OB history to HPI:1} Chief Complaint  Patient presents with   Headache    Danielle Vazquez is a 53 y.o. female with PMH as listed below who presents with headache since this afternoon with nausea, hx of migraines   Past Medical History:  Diagnosis Date   Anxiety    Chronic kidney disease    kdiney stone    Chronic pain syndrome    DDD (degenerative disc disease)    has had injections in past   Degenerative disc disease    Diverticulosis    Gastritis    GERD (gastroesophageal reflux disease)    Hiatal hernia    Low back pain    Migraines    Numbness and tingling    Vertigo        Home Medications Prior to Admission medications   Medication Sig Start Date End Date Taking? Authorizing Provider  atorvastatin (LIPITOR) 10 MG tablet Take 10 mg by mouth daily.    [provider]  furosemide (LASIX) 20 MG tablet Take 1 tablet (20 mg total) by mouth daily. 01/04/22   Raspet, Noberto Retort, PA-C  gabapentin (NEURONTIN) 300 MG capsule Take 300 mg by mouth 3 (three) times daily as needed (for pain).  03/15/18   [provider]  ibuprofen (ADVIL) 800 MG tablet Take 1 tablet (800 mg total) by mouth 3 (three) times daily. 07/28/21   Wallis Bamberg, PA-C  Oxycodone HCl 10 MG TABS Take 10 mg by mouth 4 (four) times daily as needed. 10/26/20   [provider]  pantoprazole (PROTONIX) 20 MG tablet Take 1 tablet (20 mg total) by mouth daily. Patient not taking: Reported on 11/01/2020 10/04/19   Bethann Berkshire, MD  zolpidem (AMBIEN) 5 MG tablet Take 5 mg by mouth at bedtime as needed for sleep.    [provider]      Allergies    Hydrocodone, Codeine, and Latex    Review of Systems   Review of Systems A 10 point review of systems was performed and is negative unless otherwise  reported in HPI.  Physical Exam Updated Vital Signs BP 125/78 (BP Location: Right Arm)   Pulse 94   Temp 98.4 F (36.9 C) (Oral)   Resp 17   Ht 5\' 5"  (1.651 m)   Wt 89.8 kg   LMP 09/09/2016 Comment: neg preg  SpO2 96%   BMI 32.95 kg/m  Physical Exam General: Normal appearing {Desc; female/female:11659}, lying in bed.  HEENT: PERRLA, Sclera anicteric, MMM, trachea midline.  Cardiology: RRR, no murmurs/rubs/gallops. BL radial and DP pulses equal bilaterally.  Resp: Normal respiratory rate and effort. CTAB, no wheezes, rhonchi, crackles.  Abd: Soft, non-tender, non-distended. No rebound tenderness or guarding.  GU: Deferred. MSK: No peripheral edema or signs of trauma. Extremities without deformity or TTP. No cyanosis or clubbing. Skin: warm, dry. No rashes or lesions. Back: No CVA tenderness Neuro: A&Ox4, CNs II-XII grossly intact. MAEs. Sensation grossly intact.  Psych: Normal mood and affect.   ED Results / Procedures / Treatments   Labs (all labs ordered are listed, but only abnormal results are displayed) Labs Reviewed - No data to display  EKG None  Radiology No results found.  Procedures Procedures  {Document cardiac monitor, telemetry assessment procedure when appropriate:1}  Medications Ordered in ED Medications -  No data to display  ED Course/ Medical Decision Making/ A&P                          Medical Decision Making   This patient presents to the ED for concern of ***, this involves an extensive number of treatment options, and is a complaint that carries with it a high risk of complications and morbidity.  I considered the following differential and admission for this acute, potentially life threatening condition.   MDM:    ***     Labs: I Ordered, and personally interpreted labs.  The pertinent results include:  ***  Imaging Studies ordered: I ordered imaging studies including *** I independently visualized and interpreted imaging. I agree  with the radiologist interpretation  Additional history obtained from ***.  External records from outside source obtained and reviewed including ***  Cardiac Monitoring: The patient was maintained on a cardiac monitor.  I personally viewed and interpreted the cardiac monitored which showed an underlying rhythm of: ***  Reevaluation: After the interventions noted above, I reevaluated the patient and found that they have :{resolved/improved/worsened:23923::"improved"}  Social Determinants of Health: ***  Disposition:  ***  Co morbidities that complicate the patient evaluation  Past Medical History:  Diagnosis Date   Anxiety    Chronic kidney disease    kdiney stone    Chronic pain syndrome    DDD (degenerative disc disease)    has had injections in past   Degenerative disc disease    Diverticulosis    Gastritis    GERD (gastroesophageal reflux disease)    Hiatal hernia    Low back pain    Migraines    Numbness and tingling    Vertigo      Medicines No orders of the defined types were placed in this encounter.   I have reviewed the patients home medicines and have made adjustments as needed  Problem List / ED Course: Problem List Items Addressed This Visit   None        {Document critical care time when appropriate:1} {Document review of labs and clinical decision tools ie heart score, Chads2Vasc2 etc:1}  {Document your independent review of radiology images, and any outside records:1} {Document your discussion with family members, caretakers, and with consultants:1} {Document social determinants of health affecting pt's care:1} {Document your decision making why or why not admission, treatments were needed:1}  This note was created using dictation software, which may contain spelling or grammatical errors.

## 2023-09-23 NOTE — ED Notes (Signed)
 Patient transported to CT

## 2023-09-23 NOTE — ED Triage Notes (Signed)
 Pt c/o headache since this afternoon with nausea, hx of migraines

## 2023-09-24 LAB — RESP PANEL BY RT-PCR (RSV, FLU A&B, COVID)  RVPGX2
Influenza A by PCR: NEGATIVE
Influenza B by PCR: NEGATIVE
Resp Syncytial Virus by PCR: NEGATIVE
SARS Coronavirus 2 by RT PCR: NEGATIVE

## 2023-09-24 MED ORDER — DEXAMETHASONE 4 MG PO TABS: 10.0000 mg | ORAL_TABLET | Freq: Once | ORAL | Status: DC

## 2023-09-24 MED ORDER — SODIUM CHLORIDE 0.9 % IV BOLUS
1000.0000 mL | Freq: Once | INTRAVENOUS | Status: AC
  Administered 2023-09-24: 1000 mL via INTRAVENOUS

## 2023-09-24 MED ORDER — DEXAMETHASONE SODIUM PHOSPHATE 10 MG/ML IJ SOLN: 10.0000 mg | Freq: Once | INTRAMUSCULAR | Status: AC

## 2023-09-24 MED ORDER — MAGNESIUM SULFATE 2 GM/50ML IV SOLN: 2.0000 g | Freq: Once | INTRAVENOUS | Status: AC

## 2023-09-24 MED ORDER — DEXAMETHASONE SODIUM PHOSPHATE 10 MG/ML IJ SOLN
INTRAMUSCULAR | Status: AC
  Administered 2023-09-24: 10 mg via INTRAVENOUS
  Filled 2023-09-24: qty 1

## 2023-09-24 MED ORDER — MAGNESIUM SULFATE 2 GM/50ML IV SOLN
INTRAVENOUS | Status: AC
Start: 2023-09-24 — End: 2023-09-24
  Administered 2023-09-24: 2 g via INTRAVENOUS
  Filled 2023-09-24: qty 50

## 2023-09-24 NOTE — Discharge Instructions (Addendum)
 Thank you for coming to [facility] Emergency Department. You were seen for headache. We did an exam, labs, and imaging, and these showed no acute findings. Your headache improved after medicines in the ED. You were offered additional treatment but elected to be discharged. Please stay well hydrated at home. Starting tomorrow morning, You can alternate taking Tylenol and ibuprofen as needed for pain. You can take 650mg  tylenol (acetaminophen) every 4-6 hours, and 600 mg ibuprofen 3 times a day.  Please follow up with your primary care provider within 1 week. Please keep a headache diary to take to your doctor.   Do not hesitate to return to the ED or call 911 if you experience: -Worsening symptoms -Blurred or double vision -Numbness/tingling, asymmetric weakness -Difficulty speaking or swallowing -Lightheadedness, passing out -Fevers/chills -Anything else that concerns you

## 2023-10-05 ENCOUNTER — Ambulatory Visit: Admitting: Gastroenterology

## 2023-10-12 ENCOUNTER — Ambulatory Visit: Admitting: Gastroenterology

## 2023-10-12 NOTE — Progress Notes (Deleted)
 GI Office Note    Referring Provider: Narda Bacon, MD Primary Care Physician:  Narda Bacon, MD  Primary Gastroenterologist: Rolando Cliche. Mordechai April, DO  Chief Complaint   No chief complaint on file.   History of Present Illness   Danielle Vazquez is a 53 y.o. female presenting today at the request of Narda Bacon, MD for dysphagia.  EGD August 2014: - Stricture GE junction s/p dilation - Mild nonerosive gastritis and moderate duodenitis - Advised Nexium 30 minutes prior to meals, Tums 3 times daily with meals. - Biopsies negative for H. pylori, benign small bowel mucosa  Colonoscopy August 2014: - Normal TI - Moderate sigmoid diverticulosis - 3 mm rectal polyp removed - Small internal hemorrhoids - Recommend a low-fat/high-fiber diet, take Levsin 30 minutes prior to meals and at bedtime, advised tobacco cessation.  - Random colon biopsies negative.  Hyperplastic polyp present.  Last seen in April 2020 for GERD.  She was complaining of right upper quadrant pain and into the middle of her back for 6 weeks but not have any every day.  Happening with and without meals.  Had intermittently been taking pantoprazole .  Noticing solid food dysphagia with meats, had improvement with dilation in the past.  Stated Bentyl  was not helping with her right upper quadrant pain.  Admitted taking Aleve  once daily and Tylenol  4 times daily and Excedrin Migraine as needed for pain.  Holding off on EGD given COVID-19 illness emergent need for this.  Advise follow-up in 3 months.  Pantoprazole  increased to twice daily.   Today:    Wt Readings from Last 3 Encounters:  09/23/23 198 lb (89.8 kg)  11/11/20 198 lb (89.8 kg)  11/01/20 198 lb (89.8 kg)    Current Outpatient Medications  Medication Sig Dispense Refill   atorvastatin (LIPITOR) 10 MG tablet Take 10 mg by mouth daily.     furosemide  (LASIX ) 20 MG tablet Take 1 tablet (20 mg total) by mouth daily. 5 tablet 0   gabapentin (NEURONTIN) 300 MG capsule Take  300 mg by mouth 3 (three) times daily as needed (for pain).      ibuprofen  (ADVIL ) 800 MG tablet Take 1 tablet (800 mg total) by mouth 3 (three) times daily. 30 tablet 0   Oxycodone  HCl 10 MG TABS Take 10 mg by mouth 4 (four) times daily as needed.     pantoprazole  (PROTONIX ) 20 MG tablet Take 1 tablet (20 mg total) by mouth daily. (Patient not taking: Reported on 11/01/2020) 30 tablet 0   zolpidem (AMBIEN) 5 MG tablet Take 5 mg by mouth at bedtime as needed for sleep.     No current facility-administered medications for this visit.    Past Medical History:  Diagnosis Date   Anxiety    Chronic kidney disease    kdiney stone    Chronic pain syndrome    DDD (degenerative disc disease)    has had injections in past   Degenerative disc disease    Diverticulosis    Gastritis    GERD (gastroesophageal reflux disease)    Hiatal hernia    Low back pain    Migraines    Numbness and tingling    Vertigo     Past Surgical History:  Procedure Laterality Date   CHOLECYSTECTOMY  2009   COLONOSCOPY WITH PROPOFOL  N/A 02/01/2013   [id number] mucosa in the terminal ileum/Moderate diverticulosis in the sigmoid colon/ONE RECTAL POLYP REMOVED/Small internal hemorrhoids.  Random colon biopsies negative.  Rectal polyps hyperplastic.  Next colonoscopy in 10 years with MAC   ESOPHAGOGASTRODUODENOSCOPY  April 2013   SLF: esophageal stricture, gastritis, hiatal hernia, negative H.pylori   ESOPHAGOGASTRODUODENOSCOPY (EGD) WITH ESOPHAGEAL DILATION N/A 02/01/2013   [id number] at the gastroesophageal junction/MILD Non-erosive gastritis & MODERATE DUODENITIS.  Duodenal biopsies benign.  No abnormalities.  Stomach showed mild chronic inflammation, no H. pylori.  Esophageal biopsies consistent with GERD.   ILEOColonoscopy  09/23/2011   FAO:ZHYQMV, multiple in the rectum/Polyps, multiple in the distal transverse colon/Diverticulosis,mild,left sided diverticulosis/Internal hemorrhoids/path: tubular adenomas    TUBAL LIGATION  1993   TUBAL LIGATION  1993    Family History  Problem Relation Age of Onset   Heart attack Mother    Heart attack Father    Colon cancer Neg Hx    Anesthesia problems Neg Hx    Hypotension Neg Hx    Malignant hyperthermia Neg Hx    Pseudochol deficiency Neg Hx     Allergies as of 10/12/2023 - Review Complete 09/23/2023  Allergen Reaction Noted   Hydrocodone  Nausea Only 02/12/2015   Codeine Nausea Only and Rash 09/09/2010   Latex Rash 02/22/2021    Social History   Socioeconomic History   Marital status: Single    Spouse name: Not on file   Number of children: 3   Years of education: 11th grade   Highest education level: Not on file  Occupational History   Occupation: manufacturer's associate  Tobacco Use   Smoking status: Every Day    Current packs/day: 0.50    Average packs/day: 0.5 packs/day for 25.0 years (12.5 ttl pk-yrs)    Types: Cigarettes   Smokeless tobacco: Never  Vaping Use   Vaping status: Never Used  Substance and Sexual Activity   Alcohol use: Not Currently    Comment: rarely   Drug use: No   Sexual activity: Yes    Birth control/protection: Surgical, Other-see comments, Post-menopausal    Comment: tubal  Other Topics Concern   Not on file  Social History Narrative   Lives with her daughter and grandchildren.   Right-handed.   4-5 cups caffeine per day.   Social Drivers of Corporate investment banker Strain: Not on file  Food Insecurity: Not on file  Transportation Needs: Not on file  Physical Activity: Not on file  Stress: Not on file  Social Connections: Not on file  Intimate Partner Violence: Not on file     Review of Systems   Gen: Denies any fever, chills, fatigue, weight loss, lack of appetite.  CV: Denies chest pain, heart palpitations, peripheral edema, syncope.  Resp: Denies shortness of breath at rest or with exertion. Denies wheezing or cough.  GI: see HPI GU : Denies urinary burning, urinary frequency,  urinary hesitancy MS: Denies joint pain, muscle weakness, cramps, or limitation of movement.  Derm: Denies rash, itching, dry skin Psych: Denies depression, anxiety, memory loss, and confusion Heme: Denies bruising, bleeding, and enlarged lymph nodes.  Physical Exam   LMP 09/09/2016 Comment: neg preg  General:   Alert and oriented. Pleasant and cooperative. Well-nourished and well-developed.  Head:  Normocephalic and atraumatic. Eyes:  Without icterus, sclera clear and conjunctiva pink.  Ears:  Normal auditory acuity. Mouth:  No deformity or lesions, oral mucosa pink.  Lungs:  Clear to auscultation bilaterally. No wheezes, rales, or rhonchi. No distress.  Heart:  S1, S2 present without murmurs appreciated.  Abdomen:  +BS, soft, non-tender and non-distended. No HSM noted. No guarding or rebound. No masses appreciated.  Rectal:  Deferred  Msk:  Symmetrical without gross deformities. Normal posture. Extremities:  Without edema. Neurologic:  Alert and  oriented x4;  grossly normal neurologically. Skin:  Intact without significant lesions or rashes. Psych:  Alert and cooperative. Normal mood and affect.  Assessment   Danielle Vazquez is a 53 y.o. female with a history of *** presenting today with   GERD, dysphagia, history of esophageal stricture: Last EGD in 2014 with GE junction stricture s/p dilation. ***  History of colon polyp: Last colonoscopy in 2014 with hyperplastic polyp removed from the rectum, exam otherwise normal other than small internal hemorrhoids and diverticulosis. ***  PLAN   ***    Julian Obey, MSN, FNP-BC, AGACNP-BC [facility]

## 2023-10-23 NOTE — Progress Notes (Deleted)
 GI Office Note    Referring Provider: Narda Bacon, MD Primary Care Physician:  Narda Bacon, MD  Primary Gastroenterologist: Rolando Cliche. Mordechai April, DO  Chief Complaint   No chief complaint on file.  History of Present Illness   Danielle Vazquez is a 53 y.o. female presenting today at the request of Narda Bacon, MD for dysphagia.  EGD August 2014: - Stricture GE junction s/p dilation - Mild nonerosive gastritis and moderate duodenitis - Advised Nexium 30 minutes prior to meals, Tums 3 times daily with meals. - Biopsies negative for H. pylori, benign small bowel mucosa   Colonoscopy August 2014: - Normal TI - Moderate sigmoid diverticulosis - 3 mm rectal polyp removed - Small internal hemorrhoids - Recommend a low-fat/high-fiber diet, take Levsin 30 minutes prior to meals and at bedtime, advised tobacco cessation.  - Random colon biopsies negative.  Hyperplastic polyp present.   Last seen in April 2020 for GERD.  She was complaining of right upper quadrant pain and into the middle of her back for 6 weeks but not have any every day.  Happening with and without meals.  Had intermittently been taking pantoprazole .  Noticing solid food dysphagia with meats, had improvement with dilation in the past.  Stated Bentyl  was not helping with her right upper quadrant pain.  Admitted taking Aleve  once daily and Tylenol  4 times daily and Excedrin Migraine as needed for pain.  Holding off on EGD given COVID-19 illness emergent need for this.  Advise follow-up in 3 months.  Pantoprazole  increased to twice daily.     Today:    Wt Readings from Last 3 Encounters:  09/23/23 198 lb (89.8 kg)  11/11/20 198 lb (89.8 kg)  11/01/20 198 lb (89.8 kg)    Current Outpatient Medications  Medication Sig Dispense Refill   atorvastatin (LIPITOR) 10 MG tablet Take 10 mg by mouth daily.     furosemide  (LASIX ) 20 MG tablet Take 1 tablet (20 mg total) by mouth daily. 5 tablet 0   gabapentin (NEURONTIN) 300 MG capsule  Take 300 mg by mouth 3 (three) times daily as needed (for pain).      ibuprofen  (ADVIL ) 800 MG tablet Take 1 tablet (800 mg total) by mouth 3 (three) times daily. 30 tablet 0   Oxycodone  HCl 10 MG TABS Take 10 mg by mouth 4 (four) times daily as needed.     pantoprazole  (PROTONIX ) 20 MG tablet Take 1 tablet (20 mg total) by mouth daily. (Patient not taking: Reported on 11/01/2020) 30 tablet 0   zolpidem (AMBIEN) 5 MG tablet Take 5 mg by mouth at bedtime as needed for sleep.     No current facility-administered medications for this visit.    Past Medical History:  Diagnosis Date   Anxiety    Chronic kidney disease    kdiney stone    Chronic pain syndrome    DDD (degenerative disc disease)    has had injections in past   Degenerative disc disease    Diverticulosis    Gastritis    GERD (gastroesophageal reflux disease)    Hiatal hernia    Low back pain    Migraines    Numbness and tingling    Vertigo     Past Surgical History:  Procedure Laterality Date   CHOLECYSTECTOMY  2009   COLONOSCOPY WITH PROPOFOL  N/A 02/01/2013   ZOX:WRUEAV mucosa in the terminal ileum/Moderate diverticulosis in the sigmoid colon/ONE RECTAL POLYP REMOVED/Small internal hemorrhoids.  Random colon biopsies negative.  Rectal polyps hyperplastic.  Next colonoscopy in 10 years with MAC   ESOPHAGOGASTRODUODENOSCOPY  April 2013   SLF: esophageal stricture, gastritis, hiatal hernia, negative H.pylori   ESOPHAGOGASTRODUODENOSCOPY (EGD) WITH ESOPHAGEAL DILATION N/A 02/01/2013   [id number] at the gastroesophageal junction/MILD Non-erosive gastritis & MODERATE DUODENITIS.  Duodenal biopsies benign.  No abnormalities.  Stomach showed mild chronic inflammation, no H. pylori.  Esophageal biopsies consistent with GERD.   ILEOColonoscopy  09/23/2011   WJX:[id number], multiple in the rectum/Polyps, multiple in the distal transverse colon/Diverticulosis,mild,left sided diverticulosis/Internal hemorrhoids/path: tubular adenomas    TUBAL LIGATION  1993   TUBAL LIGATION  1993    Family History  Problem Relation Age of Onset   Heart attack Mother    Heart attack Father    Colon cancer Neg Hx    Anesthesia problems Neg Hx    Hypotension Neg Hx    Malignant hyperthermia Neg Hx    Pseudochol deficiency Neg Hx     Allergies as of 10/27/2023 - Review Complete 09/23/2023  Allergen Reaction Noted   Hydrocodone  Nausea Only 02/12/2015   Codeine Nausea Only and Rash 09/09/2010   Latex Rash 02/22/2021    Social History   Socioeconomic History   Marital status: Single    Spouse name: Not on file   Number of children: 3   Years of education: 11th grade   Highest education level: Not on file  Occupational History   Occupation: manufacturer's associate  Tobacco Use   Smoking status: Every Day    Current packs/day: 0.50    Average packs/day: 0.5 packs/day for 25.0 years (12.5 ttl pk-yrs)    Types: Cigarettes   Smokeless tobacco: Never  Vaping Use   Vaping status: Never Used  Substance and Sexual Activity   Alcohol use: Not Currently    Comment: rarely   Drug use: No   Sexual activity: Yes    Birth control/protection: Surgical, Other-see comments, Post-menopausal    Comment: tubal  Other Topics Concern   Not on file  Social History Narrative   Lives with her daughter and grandchildren.   Right-handed.   4-5 cups caffeine per day.   Social Drivers of Corporate investment banker Strain: Not on file  Food Insecurity: Not on file  Transportation Needs: Not on file  Physical Activity: Not on file  Stress: Not on file  Social Connections: Not on file  Intimate Partner Violence: Not on file     Review of Systems   Gen: Denies any fever, chills, fatigue, weight loss, lack of appetite.  CV: Denies chest pain, heart palpitations, peripheral edema, syncope.  Resp: Denies shortness of breath at rest or with exertion. Denies wheezing or cough.  GI: see HPI GU : Denies urinary burning, urinary frequency,  urinary hesitancy MS: Denies joint pain, muscle weakness, cramps, or limitation of movement.  Derm: Denies rash, itching, dry skin Psych: Denies depression, anxiety, memory loss, and confusion Heme: Denies bruising, bleeding, and enlarged lymph nodes.  Physical Exam   LMP 09/09/2016 Comment: neg preg  General:   Alert and oriented. Pleasant and cooperative. Well-nourished and well-developed.  Head:  Normocephalic and atraumatic. Eyes:  Without icterus, sclera clear and conjunctiva pink.  Ears:  Normal auditory acuity. Mouth:  No deformity or lesions, oral mucosa pink.  Lungs:  Clear to auscultation bilaterally. No wheezes, rales, or rhonchi. No distress.  Heart:  S1, S2 present without murmurs appreciated.  Abdomen:  +BS, soft, non-tender and non-distended. No HSM noted. No guarding or rebound.  No masses appreciated.  Rectal:  Deferred  Msk:  Symmetrical without gross deformities. Normal posture. Extremities:  Without edema. Neurologic:  Alert and  oriented x4;  grossly normal neurologically. Skin:  Intact without significant lesions or rashes. Psych:  Alert and cooperative. Normal mood and affect.  Assessment   Danielle Vazquez is a 53 y.o. female with a history of *** presenting today with   GERD, dysphagia, history of esophageal stricture: Last EGD in 2014 with GE junction stricture s/p dilation. ***   History of colon polyp: Last colonoscopy in 2014 with hyperplastic polyp removed from the rectum, exam otherwise normal other than small internal hemorrhoids and diverticulosis. ***   PLAN   ***    Julian Obey, MSN, FNP-BC, AGACNP-BC [facility]

## 2023-10-27 ENCOUNTER — Ambulatory Visit: Admitting: Gastroenterology

## 2023-10-27 ENCOUNTER — Ambulatory Visit
Admission: EM | Admit: 2023-10-27 | Discharge: 2023-10-27 | Disposition: A | Discharge status: Home / Self Care | Attending: Nurse Practitioner | Admitting: Nurse Practitioner

## 2023-10-27 DIAGNOSIS — J209 Acute bronchitis, unspecified: Secondary | ICD-10-CM | POA: Diagnosis not present

## 2023-10-27 LAB — POCT RAPID STREP A (OFFICE): Rapid Strep A Screen: NEGATIVE

## 2023-10-27 MED ORDER — PREDNISONE 20 MG PO TABS: 40.0000 mg | ORAL_TABLET | Freq: Every day | ORAL | 0 refills | Status: AC

## 2023-10-27 MED ORDER — BENZONATATE 100 MG PO CAPS: 100.0000 mg | ORAL_CAPSULE | Freq: Three times a day (TID) | ORAL | 0 refills | Status: DC | PRN

## 2023-10-27 MED ORDER — METHYLPREDNISOLONE ACETATE 40 MG/ML IJ SUSP
40.0000 mg | Freq: Once | INTRAMUSCULAR | Status: AC
  Administered 2023-10-27: 40 mg via INTRAMUSCULAR

## 2023-10-27 NOTE — ED Provider Notes (Signed)
 [facility]    CSN: [id number] Arrival date & time: 10/27/23  1609      History   Chief Complaint No chief complaint on file.   HPI Danielle BOHNENKAMP is a 53 y.o. female.   Patient presents today with 3 day history of fever, TMAX 102 F, congested cough, stuffy nose, runny nose, sore throat, headache, nausea, decreased appetite, and fatigue.  No shortness of breath or chest pain, abdominal pain, vomiting, diarrhea, or known sick contacts.  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  Has taken Nyquil and used "breathing machine" yesterday and 2 days ago which did seem to help with breathing.  Has not taken any antipyretics today.  Patient is a current smoker, smokes 3/4 packs of cigarettes per day for the past 20 years.  Denies history of chronic lung disease, although has been prescribed albuterol  via nebulizer in the past when she is sick.  Denies history of asthma as a child.    Past Medical History:  Diagnosis Date   Anxiety    Chronic kidney disease    kdiney stone    Chronic pain syndrome    DDD (degenerative disc disease)    has had injections in past   Degenerative disc disease    Diverticulosis    Gastritis    GERD (gastroesophageal reflux disease)    Hiatal hernia    Low back pain    Migraines    Numbness and tingling    Vertigo     Patient Active Problem List   Diagnosis Date Noted   Pelvic pain 04/04/2019   Urinary tract infection without hematuria 04/04/2019   Urinary frequency 04/04/2019   RUQ pain 09/27/2018   Loose stools 01/13/2013   GERD (gastroesophageal reflux disease) 09/03/2011   Helicobacter pylori ab+ 07/22/2011   Hematochezia 07/22/2011   Abdominal pain, other specified site 07/22/2011    Past Surgical History:  Procedure Laterality Date   CHOLECYSTECTOMY  2009   COLONOSCOPY WITH PROPOFOL  N/A 02/01/2013   DGL:OVFIEP mucosa in the terminal ileum/Moderate diverticulosis in the sigmoid colon/ONE RECTAL POLYP REMOVED/Small internal  hemorrhoids.  Random colon biopsies negative.  Rectal polyps hyperplastic.  Next colonoscopy in 10 years with MAC   ESOPHAGOGASTRODUODENOSCOPY  April 2013   SLF: esophageal stricture, gastritis, hiatal hernia, negative H.pylori   ESOPHAGOGASTRODUODENOSCOPY (EGD) WITH ESOPHAGEAL DILATION N/A 02/01/2013   [id number]:[id number] at the gastroesophageal junction/MILD Non-erosive gastritis & MODERATE DUODENITIS.  Duodenal biopsies benign.  No abnormalities.  Stomach showed mild chronic inflammation, no H. pylori.  Esophageal biopsies consistent with GERD.   ILEOColonoscopy  09/23/2011   YTK:[id number], multiple in the rectum/Polyps, multiple in the distal transverse colon/Diverticulosis,mild,left sided diverticulosis/Internal hemorrhoids/path: tubular adenomas   TUBAL LIGATION  1993   TUBAL LIGATION  1993    OB History     Gravida  3   Para  3   Term  3   Preterm      AB      Living  3      SAB      IAB      Ectopic      Multiple      Live Births  3            Home Medications    Prior to Admission medications   Medication Sig Start Date End Date Taking? Authorizing Provider  benzonatate (TESSALON) 100 MG capsule Take 1 capsule (100 mg total) by mouth 3 (three) times daily as needed for  cough. Do not take with alcohol or while operating or driving heavy machinery 06/28/08  Yes Wilhemena Harbour, NP  predniSONE  (DELTASONE ) 20 MG tablet Take 2 tablets (40 mg total) by mouth daily with breakfast for 5 days. 10/27/23 11/01/23 Yes Wilhemena Harbour, NP  atorvastatin (LIPITOR) 10 MG tablet Take 10 mg by mouth daily.    [provider]  furosemide  (LASIX ) 20 MG tablet Take 1 tablet (20 mg total) by mouth daily. 01/04/22   Raspet, Erin K, PA-C  gabapentin (NEURONTIN) 300 MG capsule Take 300 mg by mouth 3 (three) times daily as needed (for pain).  03/15/18   [provider]  ibuprofen  (ADVIL ) 800 MG tablet Take 1 tablet (800 mg total) by mouth 3 (three) times daily. 07/28/21    Adolph Hoop, PA-C  Oxycodone  HCl 10 MG TABS Take 10 mg by mouth 4 (four) times daily as needed. 10/26/20   [provider]  pantoprazole  (PROTONIX ) 20 MG tablet Take 1 tablet (20 mg total) by mouth daily. Patient not taking: Reported on 11/01/2020 10/04/19   Zammit, Joseph, MD  zolpidem (AMBIEN) 5 MG tablet Take 5 mg by mouth at bedtime as needed for sleep.    [provider]    Family History Family History  Problem Relation Age of Onset   Heart attack Mother    Heart attack Father    Colon cancer Neg Hx    Anesthesia problems Neg Hx    Hypotension Neg Hx    Malignant hyperthermia Neg Hx    Pseudochol deficiency Neg Hx     Social History Social History   Tobacco Use   Smoking status: Every Day    Current packs/day: 0.50    Average packs/day: 0.5 packs/day for 25.0 years (12.5 ttl pk-yrs)    Types: Cigarettes   Smokeless tobacco: Never  Vaping Use   Vaping status: Never Used  Substance Use Topics   Alcohol use: Not Currently    Comment: rarely   Drug use: No     Allergies   Hydrocodone , Codeine, and Latex   Review of Systems Review of Systems Per HPI  Physical Exam Triage Vital Signs ED Triage Vitals  Encounter Vitals Group     BP 10/27/23 1620 132/88     Systolic BP Percentile --      Diastolic BP Percentile --      Pulse Rate 10/27/23 1620 100     Resp 10/27/23 1620 16     Temp 10/27/23 1620 98.1 F (36.7 C)     Temp Source 10/27/23 1620 Oral     SpO2 10/27/23 1620 94 %     Weight --      Height --      Head Circumference --      Peak Flow --      Pain Score 10/27/23 1622 9     Pain Loc --      Pain Education --      Exclude from Growth Chart --    No data found.  Updated Vital Signs BP 132/88 (BP Location: Right Arm)   Pulse 100   Temp 98.1 F (36.7 C) (Oral)   Resp 16   LMP 09/09/2016 Comment: neg preg  SpO2 94%   Visual Acuity Right Eye Distance:   Left Eye Distance:   Bilateral Distance:    Right Eye Near:   Left  Eye Near:    Bilateral Near:     Physical Exam Vitals and nursing note  reviewed.  Constitutional:      General: She is not in acute distress.    Appearance: Normal appearance. She is not ill-appearing or toxic-appearing.  HENT:     Head: Normocephalic and atraumatic.     Right Ear: Tympanic membrane, ear canal and external ear normal.     Left Ear: Tympanic membrane, ear canal and external ear normal.     Nose: Congestion present. No rhinorrhea.     Mouth/Throat:     Mouth: Mucous membranes are moist.     Pharynx: Oropharynx is clear. No oropharyngeal exudate or posterior oropharyngeal erythema.  Eyes:     General: No scleral icterus.    Extraocular Movements: Extraocular movements intact.  Cardiovascular:     Rate and Rhythm: Normal rate and regular rhythm.  Pulmonary:     Effort: Pulmonary effort is normal. No respiratory distress.     Breath sounds: Wheezing present. No rhonchi or rales.  Musculoskeletal:     Cervical back: Normal range of motion and neck supple.  Lymphadenopathy:     Cervical: No cervical adenopathy.  Skin:    General: Skin is warm and dry.     Coloration: Skin is not jaundiced or pale.     Findings: No erythema or rash.  Neurological:     Mental Status: She is alert and oriented to person, place, and time.  Psychiatric:        Behavior: Behavior is cooperative.      UC Treatments / Results  Labs (all labs ordered are listed, but only abnormal results are displayed) Labs Reviewed  POCT RAPID STREP A (OFFICE) - Normal    EKG   Radiology No results found.  Procedures Procedures (including critical care time)  Medications Ordered in UC Medications  methylPREDNISolone  acetate (DEPO-MEDROL ) injection 40 mg (40 mg Intramuscular Given 10/27/23 1650)    Initial Impression / Assessment and Plan / UC Course  I have reviewed the triage vital signs and the nursing notes.  Pertinent labs & imaging results that were available during my care of the  patient were reviewed by me and considered in my medical decision making (see chart for details).   Patient is well-appearing, normotensive, afebrile, not tachycardic, not tachypneic, oxygenating well on room air.    1. Acute bronchitis, unspecified organism Vitals and examination are reassuring today Rapid strep throat test is negative Suspect acute bronchitis due to viral cause Supportive care discussed with patient including start nebulizer scheduled every 6 hours x 48 hours, then as needed - patient does not need a refill of the medicine today Depo Medrol  IM given in urgent care today for lung inflammation, start oral prednisone  tomorrow Also recommended starting Mucinex to help with congestion and drinking plenty of fluids Return and ER precautions discussed Work excuse provided today  The patient was given the opportunity to ask questions.  All questions answered to their satisfaction.  The patient is in agreement to this plan.    Final Clinical Impressions(s) / UC Diagnoses   Final diagnoses:  Acute bronchitis, unspecified organism     Discharge Instructions      You have a viral upper respiratory infection that is causing bronchitis of your lungs.  We gave you an injection of steroid medicine today, start the steroid pills tomorrow morning.  Start using the nebulizer machine scheduled every 6 hours for the next 2 days and then use as needed.  Symptoms should improve over the next week to 10 days.  If you develop  chest pain or shortness of breath, go to the emergency room.  Some things that can make you feel better are: - Increased rest - Increasing fluid with water /sugar free electrolytes - Acetaminophen  and ibuprofen  as needed for fever/pain - Salt water  gargling, chloraseptic spray and throat lozenges - OTC guaifenesin (Mucinex) 600 mg twice daily for congestion - Saline sinus flushes or a neti pot - Humidifying the air -Tessalon Perles every 8 hours as needed for dry  cough      ED Prescriptions     Medication Sig Dispense Auth. Provider   predniSONE  (DELTASONE ) 20 MG tablet Take 2 tablets (40 mg total) by mouth daily with breakfast for 5 days. 10 tablet Thena Fireman A, NP   benzonatate (TESSALON) 100 MG capsule Take 1 capsule (100 mg total) by mouth 3 (three) times daily as needed for cough. Do not take with alcohol or while operating or driving heavy machinery 21 capsule Wilhemena Harbour, NP      PDMP not reviewed this encounter.   Wilhemena Harbour, NP 10/27/23 [phone]

## 2023-10-27 NOTE — Discharge Instructions (Addendum)
 You have a viral upper respiratory infection that is causing bronchitis of your lungs.  We gave you an injection of steroid medicine today, start the steroid pills tomorrow morning.  Start using the nebulizer machine scheduled every 6 hours for the next 2 days and then use as needed.  Symptoms should improve over the next week to 10 days.  If you develop chest pain or shortness of breath, go to the emergency room.  Some things that can make you feel better are: - Increased rest - Increasing fluid with water /sugar free electrolytes - Acetaminophen  and ibuprofen  as needed for fever/pain - Salt water  gargling, chloraseptic spray and throat lozenges - OTC guaifenesin (Mucinex) 600 mg twice daily for congestion - Saline sinus flushes or a neti pot - Humidifying the air -Tessalon Perles every 8 hours as needed for dry cough

## 2023-10-27 NOTE — ED Triage Notes (Signed)
 Pt reports she has had throat pain, hurts to swallow, and a cough x 1 day

## 2023-11-03 NOTE — Progress Notes (Deleted)
 GI Office Note    Referring Provider: Narda Bacon, MD Primary Care Physician:  Narda Bacon, MD  Primary Gastroenterologist: *** Chief Complaint   No chief complaint on file.   History of Present Illness   Danielle Vazquez is a 53 y.o. female presenting today at the request of Narda Bacon, MD for dysphagia.   EGD August 2014: - Stricture GE junction s/p dilation - Mild nonerosive gastritis and moderate duodenitis - Advised Nexium 30 minutes prior to meals, Tums 3 times daily with meals. - Biopsies negative for H. pylori, benign small bowel mucosa   Colonoscopy August 2014: - Normal TI - Moderate sigmoid diverticulosis - 3 mm rectal polyp removed - Small internal hemorrhoids - Recommend a low-fat/high-fiber diet, take Levsin 30 minutes prior to meals and at bedtime, advised tobacco cessation.  - Random colon biopsies negative.  Hyperplastic polyp present.   Last seen in April 2020 for GERD.  She was complaining of right upper quadrant pain and into the middle of her back for 6 weeks but not have any every day.  Happening with and without meals.  Had intermittently been taking pantoprazole .  Noticing solid food dysphagia with meats, had improvement with dilation in the past.  Stated Bentyl  was not helping with her right upper quadrant pain.  Admitted taking Aleve  once daily and Tylenol  4 times daily and Excedrin Migraine as needed for pain.  Holding off on EGD given COVID-19 illness emergent need for this.  Advise follow-up in 3 months.  Pantoprazole  increased to twice daily.   Today:    Wt Readings from Last 3 Encounters:  09/23/23 198 lb (89.8 kg)  11/11/20 198 lb (89.8 kg)  11/01/20 198 lb (89.8 kg)    Current Outpatient Medications  Medication Sig Dispense Refill   atorvastatin (LIPITOR) 10 MG tablet Take 10 mg by mouth daily.     benzonatate  (TESSALON ) 100 MG capsule Take 1 capsule (100 mg total) by mouth 3 (three) times daily as needed for cough. Do not take with alcohol or  while operating or driving heavy machinery 21 capsule 0   furosemide  (LASIX ) 20 MG tablet Take 1 tablet (20 mg total) by mouth daily. 5 tablet 0   gabapentin (NEURONTIN) 300 MG capsule Take 300 mg by mouth 3 (three) times daily as needed (for pain).      ibuprofen  (ADVIL ) 800 MG tablet Take 1 tablet (800 mg total) by mouth 3 (three) times daily. 30 tablet 0   Oxycodone  HCl 10 MG TABS Take 10 mg by mouth 4 (four) times daily as needed.     pantoprazole  (PROTONIX ) 20 MG tablet Take 1 tablet (20 mg total) by mouth daily. (Patient not taking: Reported on 11/01/2020) 30 tablet 0   zolpidem (AMBIEN) 5 MG tablet Take 5 mg by mouth at bedtime as needed for sleep.     No current facility-administered medications for this visit.    Past Medical History:  Diagnosis Date   Anxiety    Chronic kidney disease    kdiney stone    Chronic pain syndrome    DDD (degenerative disc disease)    has had injections in past   Degenerative disc disease    Diverticulosis    Gastritis    GERD (gastroesophageal reflux disease)    Hiatal hernia    Low back pain    Migraines    Numbness and tingling    Vertigo     Past Surgical History:  Procedure Laterality Date  CHOLECYSTECTOMY  2009   COLONOSCOPY WITH PROPOFOL  N/A 02/01/2013   ZOX:WRUEAV mucosa in the terminal ileum/Moderate diverticulosis in the sigmoid colon/ONE RECTAL POLYP REMOVED/Small internal hemorrhoids.  Random colon biopsies negative.  Rectal polyps hyperplastic.  Next colonoscopy in 10 years with MAC   ESOPHAGOGASTRODUODENOSCOPY  April 2013   SLF: esophageal stricture, gastritis, hiatal hernia, negative H.pylori   ESOPHAGOGASTRODUODENOSCOPY (EGD) WITH ESOPHAGEAL DILATION N/A 02/01/2013   [id number] at the gastroesophageal junction/MILD Non-erosive gastritis & MODERATE DUODENITIS.  Duodenal biopsies benign.  No abnormalities.  Stomach showed mild chronic inflammation, no H. pylori.  Esophageal biopsies consistent with GERD.   ILEOColonoscopy   09/23/2011   [id number], multiple in the rectum/Polyps, multiple in the distal transverse colon/Diverticulosis,mild,left sided diverticulosis/Internal hemorrhoids/path: tubular adenomas   TUBAL LIGATION  1993   TUBAL LIGATION  1993    Family History  Problem Relation Age of Onset   Heart attack Mother    Heart attack Father    Colon cancer Neg Hx    Anesthesia problems Neg Hx    Hypotension Neg Hx    Malignant hyperthermia Neg Hx    Pseudochol deficiency Neg Hx     Allergies as of 11/09/2023 - Review Complete 10/27/2023  Allergen Reaction Noted   Hydrocodone  Nausea Only 02/12/2015   Codeine Nausea Only and Rash 09/09/2010   Latex Rash 02/22/2021    Social History   Socioeconomic History   Marital status: Single    Spouse name: Not on file   Number of children: 3   Years of education: 11th grade   Highest education level: Not on file  Occupational History   Occupation: manufacturer's associate  Tobacco Use   Smoking status: Every Day    Current packs/day: 0.50    Average packs/day: 0.5 packs/day for 25.0 years (12.5 ttl pk-yrs)    Types: Cigarettes   Smokeless tobacco: Never  Vaping Use   Vaping status: Never Used  Substance and Sexual Activity   Alcohol use: Not Currently    Comment: rarely   Drug use: No   Sexual activity: Yes    Birth control/protection: Surgical, Other-see comments, Post-menopausal    Comment: tubal  Other Topics Concern   Not on file  Social History Narrative   Lives with her daughter and grandchildren.   Right-handed.   4-5 cups caffeine per day.   Social Drivers of Corporate investment banker Strain: Not on file  Food Insecurity: Not on file  Transportation Needs: Not on file  Physical Activity: Not on file  Stress: Not on file  Social Connections: Not on file  Intimate Partner Violence: Not on file     Review of Systems   Gen: Denies any fever, chills, fatigue, weight loss, lack of appetite.  CV: Denies chest pain, heart  palpitations, peripheral edema, syncope.  Resp: Denies shortness of breath at rest or with exertion. Denies wheezing or cough.  GI: see HPI GU : Denies urinary burning, urinary frequency, urinary hesitancy MS: Denies joint pain, muscle weakness, cramps, or limitation of movement.  Derm: Denies rash, itching, dry skin Psych: Denies depression, anxiety, memory loss, and confusion Heme: Denies bruising, bleeding, and enlarged lymph nodes.  Physical Exam   LMP 09/09/2016 Comment: neg preg  General:   Alert and oriented. Pleasant and cooperative. Well-nourished and well-developed.  Head:  Normocephalic and atraumatic. Eyes:  Without icterus, sclera clear and conjunctiva pink.  Ears:  Normal auditory acuity. Mouth:  No deformity or lesions, oral mucosa pink.  Lungs:  Clear  to auscultation bilaterally. No wheezes, rales, or rhonchi. No distress.  Heart:  S1, S2 present without murmurs appreciated.  Abdomen:  +BS, soft, non-tender and non-distended. No HSM noted. No guarding or rebound. No masses appreciated.  Rectal:  Deferred  Msk:  Symmetrical without gross deformities. Normal posture. Extremities:  Without edema. Neurologic:  Alert and  oriented x4;  grossly normal neurologically. Skin:  Intact without significant lesions or rashes. Psych:  Alert and cooperative. Normal mood and affect.  Assessment   Danielle Vazquez is a 53 y.o. female with a history of vertigo, migraines, GERD, gastritis, degenerative disc disease, chronic pain, anxiety and kidney stones*** presenting today with dysphagia.    GERD, dysphagia, history of esophageal stricture: Last EGD in 2014 with GE junction stricture s/p dilation. ***   History of colon polyp: Last colonoscopy in 2014 with hyperplastic polyp removed from the rectum, exam otherwise normal other than small internal hemorrhoids and diverticulosis. ***  PLAN   ***    Julian Obey, MSN, FNP-BC, AGACNP-BC [facility]

## 2023-11-09 ENCOUNTER — Ambulatory Visit: Admitting: Gastroenterology

## 2023-11-09 ENCOUNTER — Encounter: Payer: Self-pay | Admitting: Gastroenterology

## 2023-11-20 ENCOUNTER — Other Ambulatory Visit (HOSPITAL_COMMUNITY): Payer: Self-pay | Admitting: Internal Medicine

## 2023-11-20 DIAGNOSIS — Z780 Asymptomatic menopausal state: Secondary | ICD-10-CM

## 2023-12-02 ENCOUNTER — Other Ambulatory Visit (HOSPITAL_COMMUNITY)

## 2024-02-24 ENCOUNTER — Other Ambulatory Visit: Payer: Self-pay

## 2024-02-24 ENCOUNTER — Encounter: Payer: Self-pay | Admitting: Emergency Medicine

## 2024-02-24 ENCOUNTER — Ambulatory Visit: Admission: EM | Admit: 2024-02-24 | Discharge: 2024-02-24 | Disposition: A | Discharge status: Home / Self Care

## 2024-02-24 ENCOUNTER — Telehealth: Payer: Self-pay

## 2024-02-24 DIAGNOSIS — R059 Cough, unspecified: Secondary | ICD-10-CM

## 2024-02-24 DIAGNOSIS — B349 Viral infection, unspecified: Secondary | ICD-10-CM | POA: Diagnosis not present

## 2024-02-24 LAB — POCT INFLUENZA A/B
Influenza A, POC: NEGATIVE
Influenza B, POC: NEGATIVE

## 2024-02-24 LAB — POC SOFIA SARS ANTIGEN FIA: SARS Coronavirus 2 Ag: NEGATIVE

## 2024-02-24 MED ORDER — PROMETHAZINE-DM 6.25-15 MG/5ML PO SYRP: 5.0000 mL | ORAL_SOLUTION | Freq: Four times a day (QID) | ORAL | 0 refills | Status: DC | PRN

## 2024-02-24 MED ORDER — ALBUTEROL SULFATE HFA 108 (90 BASE) MCG/ACT IN AERS: 2.0000 | INHALATION_SPRAY | Freq: Four times a day (QID) | RESPIRATORY_TRACT | 0 refills | Status: AC | PRN

## 2024-02-24 MED ORDER — FLUTICASONE PROPIONATE 50 MCG/ACT NA SUSP: 2.0000 | Freq: Every day | NASAL | 0 refills | Status: AC

## 2024-02-24 NOTE — ED Triage Notes (Addendum)
 Pt reports severe headache, intermittent shortness of breath, sore throat, body aches, chills since yesterday. Denies any known fevers or taking anything otc prior to UC arrival. Has tried imitrex for headache and reports it hasn't touched it. NAD noted.   Pt tearful, mild anxiousness and dyspnea noted with exertion during triage.

## 2024-02-24 NOTE — ED Provider Notes (Signed)
 [facility]    CSN: [id number] Arrival date & time: 02/24/24  1049      History   Chief Complaint Chief Complaint  Patient presents with   Headache    HPI CARLOTTA TELFAIR is a 53 y.o. female.   The history is provided by the patient.   Patient presents for complaints of headache, shortness of breath, sore throat, body aches, and chills that started over the past 24 hours.  She denies fever, ear pain, ear drainage, wheezing, difficulty breathing, chest pain, abdominal pain, nausea, vomiting, diarrhea, or rash.  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  States that she has tried Imitrex for her headache with minimal relief.  Past Medical History:  Diagnosis Date   Anxiety    Chronic kidney disease    kdiney stone    Chronic pain syndrome    DDD (degenerative disc disease)    has had injections in past   Degenerative disc disease    Diverticulosis    Gastritis    GERD (gastroesophageal reflux disease)    Hiatal hernia    Low back pain    Migraines    Numbness and tingling    Vertigo     Patient Active Problem List   Diagnosis Date Noted   Pelvic pain 04/04/2019   Urinary tract infection without hematuria 04/04/2019   Urinary frequency 04/04/2019   RUQ pain 09/27/2018   Loose stools 01/13/2013   GERD (gastroesophageal reflux disease) 09/03/2011   Helicobacter pylori ab+ 07/22/2011   Hematochezia 07/22/2011   Abdominal pain, other specified site 07/22/2011    Past Surgical History:  Procedure Laterality Date   CHOLECYSTECTOMY  2009   COLONOSCOPY WITH PROPOFOL  N/A 02/01/2013   DOQ:Wnmfjo mucosa in the terminal ileum/Moderate diverticulosis in the sigmoid colon/ONE RECTAL POLYP REMOVED/Small internal hemorrhoids.  Random colon biopsies negative.  Rectal polyps hyperplastic.  Next colonoscopy in 10 years with MAC   ESOPHAGOGASTRODUODENOSCOPY  April 2013   SLF: esophageal stricture, gastritis, hiatal hernia, negative H.pylori    ESOPHAGOGASTRODUODENOSCOPY (EGD) WITH ESOPHAGEAL DILATION N/A 02/01/2013   DOQ:Dumprulmz at the gastroesophageal junction/MILD Non-erosive gastritis & MODERATE DUODENITIS.  Duodenal biopsies benign.  No abnormalities.  Stomach showed mild chronic inflammation, no H. pylori.  Esophageal biopsies consistent with GERD.   ILEOColonoscopy  09/23/2011   DOQ:Enobed, multiple in the rectum/Polyps, multiple in the distal transverse colon/Diverticulosis,mild,left sided diverticulosis/Internal hemorrhoids/path: tubular adenomas   TUBAL LIGATION  1993   TUBAL LIGATION  1993    OB History     Gravida  3   Para  3   Term  3   Preterm      AB      Living  3      SAB      IAB      Ectopic      Multiple      Live Births  3            Home Medications    Prior to Admission medications   Medication Sig Start Date End Date Taking? Authorizing Provider  albuterol  (VENTOLIN  HFA) 108 (90 Base) MCG/ACT inhaler Inhale 2 puffs into the lungs every 6 (six) hours as needed. 02/24/24  Yes Leath-Warren, Etta PARAS, NP  fluticasone  (FLONASE ) 50 MCG/ACT nasal spray Place 2 sprays into both nostrils daily. 02/24/24  Yes Leath-Warren, Etta PARAS, NP  meclizine  (ANTIVERT ) 25 MG tablet Take 25 mg by mouth 4 (four) times daily as needed. 10/26/23  Yes [provider]  metFORMIN (GLUCOPHAGE) 500 MG tablet Take 500 mg by mouth 2 (two) times daily. 09/18/23  Yes [provider]  promethazine  (PHENERGAN ) 25 MG tablet Take 25 mg by mouth every 6 (six) hours as needed. 10/19/23  Yes [provider]  promethazine -dextromethorphan (PROMETHAZINE -DM) 6.25-15 MG/5ML syrup Take 5 mLs by mouth 4 (four) times daily as needed. 02/24/24  Yes Leath-Warren, Etta PARAS, NP  SUMAtriptan (IMITREX) 100 MG tablet Take by mouth. 10/19/23  Yes [provider]  atorvastatin (LIPITOR) 10 MG tablet Take 10 mg by mouth daily.    [provider]  benzonatate  (TESSALON ) 100 MG capsule Take 1 capsule  (100 mg total) by mouth 3 (three) times daily as needed for cough. Do not take with alcohol or while operating or driving heavy machinery 09/23/72   Chandra Harlene LABOR, NP  furosemide  (LASIX ) 20 MG tablet Take 1 tablet (20 mg total) by mouth daily. 01/04/22   Raspet, Erin K, PA-C  gabapentin (NEURONTIN) 300 MG capsule Take 300 mg by mouth 3 (three) times daily as needed (for pain).  03/15/18   [provider]  ibuprofen  (ADVIL ) 800 MG tablet Take 1 tablet (800 mg total) by mouth 3 (three) times daily. 07/28/21   Christopher Savannah, PA-C  Oxycodone  HCl 10 MG TABS Take 10 mg by mouth 4 (four) times daily as needed. 10/26/20   [provider]  pantoprazole  (PROTONIX ) 20 MG tablet Take 1 tablet (20 mg total) by mouth daily. 10/04/19   Zammit, Joseph, MD  zolpidem (AMBIEN) 5 MG tablet Take 5 mg by mouth at bedtime as needed for sleep.    [provider]    Family History Family History  Problem Relation Age of Onset   Heart attack Mother    Heart attack Father    Colon cancer Neg Hx    Anesthesia problems Neg Hx    Hypotension Neg Hx    Malignant hyperthermia Neg Hx    Pseudochol deficiency Neg Hx     Social History Social History   Tobacco Use   Smoking status: Every Day    Current packs/day: 0.50    Average packs/day: 0.5 packs/day for 25.0 years (12.5 ttl pk-yrs)    Types: Cigarettes   Smokeless tobacco: Never  Vaping Use   Vaping status: Never Used  Substance Use Topics   Alcohol use: Not Currently    Comment: rarely   Drug use: No     Allergies   Hydrocodone , Codeine, and Latex   Review of Systems Review of Systems Per HPI  Physical Exam Triage Vital Signs ED Triage Vitals  Encounter Vitals Group     BP 02/24/24 1122 (!) 131/96     Girls Systolic BP Percentile --      Girls Diastolic BP Percentile --      Boys Systolic BP Percentile --      Boys Diastolic BP Percentile --      Pulse Rate 02/24/24 1122 96     Resp 02/24/24 1122 (!) 22     Temp  02/24/24 1122 98.2 F (36.8 C)     Temp Source 02/24/24 1122 Oral     SpO2 02/24/24 1122 97 %     Weight --      Height --      Head Circumference --      Peak Flow --      Pain Score 02/24/24 1123 8     Pain Loc --      Pain Education --  Exclude from Growth Chart --    No data found.  Updated Vital Signs BP (!) 131/96 (BP Location: Right Arm) Comment: x2 different size bp cuffs and 2 attempts.  Pulse 96   Temp 98.2 F (36.8 C) (Oral)   Resp (!) 22   LMP 09/09/2016 Comment: neg preg  SpO2 97%   Visual Acuity Right Eye Distance:   Left Eye Distance:   Bilateral Distance:    Right Eye Near:   Left Eye Near:    Bilateral Near:     Physical Exam Vitals and nursing note reviewed.  Constitutional:      General: She is not in acute distress.    Appearance: She is well-developed.  HENT:     Head: Normocephalic.     Right Ear: Tympanic membrane, ear canal and external ear normal.     Left Ear: Tympanic membrane, ear canal and external ear normal.     Nose: Congestion present.     Mouth/Throat:     Mouth: Mucous membranes are moist.  Eyes:     Extraocular Movements: Extraocular movements intact.     Conjunctiva/sclera: Conjunctivae normal.     Pupils: Pupils are equal, round, and reactive to light.  Cardiovascular:     Rate and Rhythm: Normal rate and regular rhythm.     Pulses: Normal pulses.     Heart sounds: Normal heart sounds.  Pulmonary:     Effort: Pulmonary effort is normal. No respiratory distress.     Breath sounds: Normal breath sounds. No stridor. No wheezing, rhonchi or rales.  Abdominal:     General: Bowel sounds are normal.     Palpations: Abdomen is soft.     Tenderness: There is no abdominal tenderness.  Musculoskeletal:     Cervical back: Normal range of motion.  Skin:    General: Skin is warm and dry.  Neurological:     General: No focal deficit present.     Mental Status: She is alert and oriented to person, place, and time.   Psychiatric:        Mood and Affect: Mood normal.        Behavior: Behavior normal.      UC Treatments / Results  Labs (all labs ordered are listed, but only abnormal results are displayed) Labs Reviewed  POCT INFLUENZA A/B - Normal  POC SOFIA SARS ANTIGEN FIA    EKG   Radiology No results found.  Procedures Procedures (including critical care time)  Medications Ordered in UC Medications - No data to display  Initial Impression / Assessment and Plan / UC Course  I have reviewed the triage vital signs and the nursing notes.  Pertinent labs & imaging results that were available during my care of the patient were reviewed by me and considered in my medical decision making (see chart for details).  The COVID and flu test were negative.  On exam, the patient's lung sounds are clear throughout, room air sats at 97%.  The patient is well-appearing, and is in no acute distress.  Symptoms are consistent with a viral URI with cough.  Will provide symptomatic treatment with Promethazine  DM for cough, fluticasone  50 micro nasal spray, and an albuterol  inhaler for shortness of breath.  Patient requesting flu swab results to be called.  Patient left prior to discharge.   Final Clinical Impressions(s) / UC Diagnoses   Final diagnoses:  Viral illness  Cough, unspecified type     Discharge Instructions  Patient left prior to discharge.      ED Prescriptions     Medication Sig Dispense Auth. Provider   promethazine -dextromethorphan (PROMETHAZINE -DM) 6.25-15 MG/5ML syrup Take 5 mLs by mouth 4 (four) times daily as needed. 118 mL Leath-Warren, Etta PARAS, NP   fluticasone  (FLONASE ) 50 MCG/ACT nasal spray Place 2 sprays into both nostrils daily. 16 g Leath-Warren, Etta PARAS, NP   albuterol  (VENTOLIN  HFA) 108 (90 Base) MCG/ACT inhaler Inhale 2 puffs into the lungs every 6 (six) hours as needed. 8 g Leath-Warren, Etta PARAS, NP      PDMP not reviewed this encounter.    Gilmer Etta PARAS, NP 02/24/24 1233

## 2024-02-24 NOTE — Telephone Encounter (Signed)
 Pt has been contact to provide results of testing done in clinic and recommendations from provider, pt was made aware of results, and has verbalized understanding of the recommendations and medications given by provider.

## 2024-02-24 NOTE — Discharge Instructions (Signed)
Patient left prior to discharge.  

## 2024-02-24 NOTE — ED Notes (Signed)
 Pt states she had to leave before results of FLU testing were done because her ride had some place else they needed to be provider has given okay to contact pt with results once test has been completed.

## 2024-02-24 NOTE — ED Notes (Signed)
 Pt left UC prior to results and discharge instructions. Provider reported to contact pt and review results and prescriptions. Attempted to reach pt at contact listed in chart. No answer at this time. Provider aware.

## 2024-03-14 ENCOUNTER — Other Ambulatory Visit (HOSPITAL_COMMUNITY): Payer: Self-pay | Admitting: Internal Medicine

## 2024-03-14 DIAGNOSIS — Z720 Tobacco use: Secondary | ICD-10-CM

## 2024-03-14 DIAGNOSIS — Z122 Encounter for screening for malignant neoplasm of respiratory organs: Secondary | ICD-10-CM

## 2024-03-15 ENCOUNTER — Telehealth: Admitting: Family

## 2024-03-15 ENCOUNTER — Other Ambulatory Visit (HOSPITAL_COMMUNITY): Payer: Self-pay | Admitting: Internal Medicine

## 2024-03-15 DIAGNOSIS — J029 Acute pharyngitis, unspecified: Secondary | ICD-10-CM | POA: Diagnosis not present

## 2024-03-15 DIAGNOSIS — Z1231 Encounter for screening mammogram for malignant neoplasm of breast: Secondary | ICD-10-CM

## 2024-03-15 MED ORDER — CETIRIZINE HCL 10 MG PO TABS: 10.0000 mg | ORAL_TABLET | Freq: Every day | ORAL | 1 refills | Status: AC

## 2024-03-15 MED ORDER — AMOXICILLIN-POT CLAVULANATE 875-125 MG PO TABS: 1.0000 | ORAL_TABLET | Freq: Two times a day (BID) | ORAL | 0 refills | Status: DC

## 2024-03-15 NOTE — Progress Notes (Signed)
 Virtual Visit Consent   Danielle Vazquez, you are scheduled for a virtual visit with a [facility] provider today. Just as with appointments in the office, your consent must be obtained to participate. Your consent will be active for this visit and any virtual visit you may have with one of our providers in the next 365 days. If you have a MyChart account, a copy of this consent can be sent to you electronically.  As this is a virtual visit, video technology does not allow for your provider to perform a traditional examination. This may limit your provider's ability to fully assess your condition. If your provider identifies any concerns that need to be evaluated in person or the need to arrange testing (such as labs, EKG, etc.), we will make arrangements to do so. Although advances in technology are sophisticated, we cannot ensure that it will always work on either your end or our end. If the connection with a video visit is poor, the visit may have to be switched to a telephone visit. With either a video or telephone visit, we are not always able to ensure that we have a secure connection.  By engaging in this virtual visit, you consent to the provision of healthcare and authorize for your insurance to be billed (if applicable) for the services provided during this visit. Depending on your insurance coverage, you may receive a charge related to this service.  I need to obtain your verbal consent now. Are you willing to proceed with your visit today? [name] has provided verbal consent on 03/15/2024 for a virtual visit (video or telephone). Bari Learn, FNP  Date: 03/15/2024 6:30 PM   Virtual Visit via Video Note   I, Bari Learn, connected with  Danielle Vazquez  ([id number], 07-29-1970) on 03/15/24 at  7:00 PM EDT by a video-enabled telemedicine application and verified that I am speaking with the correct person using two identifiers.  Location: Patient: Virtual Visit Location Patient:  Home Provider: Virtual Visit Location Provider: Home Office   I discussed the limitations of evaluation and management by telemedicine and the availability of in person appointments. The patient expressed understanding and agreed to proceed.    History of Present Illness: [name] is a 53 y.o. who identifies as a female who was assigned female at birth, and is being seen today for congestion been on going for weeks, but her sore throat that started two days ago. She is taking flonase , tessalon , and cough syrup without relief.   HPI: Sore Throat  This is a new problem. The current episode started in the past 7 days. The problem has been gradually worsening. Maximum temperature: low grade. The pain is at a severity of 8/10. The pain is mild. Associated symptoms include congestion, coughing, headaches, swollen glands and trouble swallowing. Pertinent negatives include no ear discharge or ear pain. She has tried NSAIDs for the symptoms. The treatment provided mild relief.    Problems:  Patient Active Problem List   Diagnosis Date Noted   Pelvic pain 04/04/2019   Urinary tract infection without hematuria 04/04/2019   Urinary frequency 04/04/2019   RUQ pain 09/27/2018   Loose stools 01/13/2013   GERD (gastroesophageal reflux disease) 09/03/2011   Helicobacter pylori ab+ 07/22/2011   Hematochezia 07/22/2011   Abdominal pain, other specified site 07/22/2011    Allergies:  Allergies  Allergen Reactions   Hydrocodone  Nausea Only   Codeine Nausea Only and Rash   Latex Rash  Medications:  Current Outpatient Medications:    amoxicillin -clavulanate (AUGMENTIN ) 875-125 MG tablet, Take 1 tablet by mouth 2 (two) times daily., Disp: 14 tablet, Rfl: 0   cetirizine  (ZYRTEC  ALLERGY) 10 MG tablet, Take 1 tablet (10 mg total) by mouth daily., Disp: 90 tablet, Rfl: 1   albuterol  (VENTOLIN  HFA) 108 (90 Base) MCG/ACT inhaler, Inhale 2 puffs into the lungs every 6 (six) hours as needed., Disp: 8 g,  Rfl: 0   atorvastatin (LIPITOR) 10 MG tablet, Take 10 mg by mouth daily., Disp: , Rfl:    benzonatate  (TESSALON ) 100 MG capsule, Take 1 capsule (100 mg total) by mouth 3 (three) times daily as needed for cough. Do not take with alcohol or while operating or driving heavy machinery, Disp: 21 capsule, Rfl: 0   fluticasone  (FLONASE ) 50 MCG/ACT nasal spray, Place 2 sprays into both nostrils daily., Disp: 16 g, Rfl: 0   furosemide  (LASIX ) 20 MG tablet, Take 1 tablet (20 mg total) by mouth daily., Disp: 5 tablet, Rfl: 0   gabapentin (NEURONTIN) 300 MG capsule, Take 300 mg by mouth 3 (three) times daily as needed (for pain). , Disp: , Rfl:    ibuprofen  (ADVIL ) 800 MG tablet, Take 1 tablet (800 mg total) by mouth 3 (three) times daily., Disp: 30 tablet, Rfl: 0   meclizine  (ANTIVERT ) 25 MG tablet, Take 25 mg by mouth 4 (four) times daily as needed., Disp: , Rfl:    metFORMIN (GLUCOPHAGE) 500 MG tablet, Take 500 mg by mouth 2 (two) times daily., Disp: , Rfl:    Oxycodone  HCl 10 MG TABS, Take 10 mg by mouth 4 (four) times daily as needed., Disp: , Rfl:    pantoprazole  (PROTONIX ) 20 MG tablet, Take 1 tablet (20 mg total) by mouth daily., Disp: 30 tablet, Rfl: 0   promethazine  (PHENERGAN ) 25 MG tablet, Take 25 mg by mouth every 6 (six) hours as needed., Disp: , Rfl:    promethazine -dextromethorphan (PROMETHAZINE -DM) 6.25-15 MG/5ML syrup, Take 5 mLs by mouth 4 (four) times daily as needed., Disp: 118 mL, Rfl: 0   SUMAtriptan (IMITREX) 100 MG tablet, Take by mouth., Disp: , Rfl:    zolpidem (AMBIEN) 5 MG tablet, Take 5 mg by mouth at bedtime as needed for sleep., Disp: , Rfl:   Observations/Objective: Patient is well-developed, well-nourished in no acute distress.  Resting comfortably  at home.  Head is normocephalic, atraumatic.  No labored breathing.  Speech is clear and coherent with logical content.  Patient is alert and oriented at baseline.  Throat mildly erythemas   Assessment and Plan: 1. Acute  pharyngitis, unspecified etiology (Primary) - cetirizine  (ZYRTEC  ALLERGY) 10 MG tablet; Take 1 tablet (10 mg total) by mouth daily.  Dispense: 90 tablet; Refill: 1 - amoxicillin -clavulanate (AUGMENTIN ) 875-125 MG tablet; Take 1 tablet by mouth 2 (two) times daily.  Dispense: 14 tablet; Refill: 0  - Take meds as prescribed - Use a cool mist humidifier  -Use saline nose sprays frequently -Force fluids -For any cough or congestion  Use plain Mucinex- regular strength or max strength is fine -For fever or aces or pains- take tylenol  or ibuprofen . -Throat lozenges if help -New toothbrush in 3 days Will treat with Augmentin  since using OTC medications without relief and symptoms on going for over two weeks.   Follow Up Instructions: I discussed the assessment and treatment plan with the patient. The patient was provided an opportunity to ask questions and all were answered. The patient agreed with the plan and demonstrated  an understanding of the instructions.  A copy of instructions were sent to the patient via MyChart unless otherwise noted below.     The patient was advised to call back or seek an in-person evaluation if the symptoms worsen or if the condition fails to improve as anticipated.    Bari Learn, FNP

## 2024-03-15 NOTE — Patient Instructions (Signed)

## 2024-04-01 ENCOUNTER — Ambulatory Visit (HOSPITAL_COMMUNITY)

## 2024-04-01 ENCOUNTER — Other Ambulatory Visit (HOSPITAL_COMMUNITY)

## 2024-04-22 ENCOUNTER — Ambulatory Visit (HOSPITAL_COMMUNITY)

## 2024-04-22 ENCOUNTER — Inpatient Hospital Stay (HOSPITAL_COMMUNITY): Admission: RE | Admit: 2024-04-22 | Source: Ambulatory Visit

## 2024-05-10 ENCOUNTER — Ambulatory Visit
Admission: EM | Admit: 2024-05-10 | Discharge: 2024-05-10 | Disposition: A | Discharge status: Home / Self Care | Attending: Nurse Practitioner | Admitting: Nurse Practitioner

## 2024-05-10 DIAGNOSIS — R109 Unspecified abdominal pain: Secondary | ICD-10-CM | POA: Insufficient documentation

## 2024-05-10 DIAGNOSIS — Z8719 Personal history of other diseases of the digestive system: Secondary | ICD-10-CM | POA: Diagnosis not present

## 2024-05-10 LAB — POCT URINE DIPSTICK
Bilirubin, UA: NEGATIVE
Blood, UA: NEGATIVE
Glucose, UA: NEGATIVE mg/dL
Ketones, POC UA: NEGATIVE mg/dL
Nitrite, UA: NEGATIVE
Protein Ur, POC: NEGATIVE mg/dL
Spec Grav, UA: 1.01 (ref 1.010–1.025)
Urobilinogen, UA: 0.2 U/dL
pH, UA: 6 (ref 5.0–8.0)

## 2024-05-10 LAB — POC COVID19/FLU A&B COMBO
Covid Antigen, POC: NEGATIVE
Influenza A Antigen, POC: NEGATIVE
Influenza B Antigen, POC: NEGATIVE

## 2024-05-10 MED ORDER — ONDANSETRON 4 MG PO TBDP: 4.0000 mg | ORAL_TABLET | Freq: Three times a day (TID) | ORAL | 0 refills | Status: DC | PRN

## 2024-05-10 MED ORDER — AMOXICILLIN-POT CLAVULANATE 875-125 MG PO TABS: 1.0000 | ORAL_TABLET | Freq: Two times a day (BID) | ORAL | 0 refills | Status: DC

## 2024-05-10 NOTE — Discharge Instructions (Addendum)
 Lab results are pending.  You will be contacted when the results are received.  You will also have access to the results via MyChart. Take medication as prescribed. Continue your current pain medications. Recommend Pedialyte or Gatorade to prevent dehydration. Recommend a BRAT diet while nausea and vomiting persist.  This includes bananas, rice, applesauce, or toast. Recommend dietary modifications to help decrease your diarrhea symptoms. Go to the emergency department if you experience worsening abdominal pain, nausea, vomiting, or diarrhea, or if you develop new symptoms of fever, chills, or other concerns. Follow-up with your primary care physician or with gastroenterology if your symptoms fail to improve. Follow-up as needed.

## 2024-05-10 NOTE — ED Provider Notes (Signed)
 [facility]    CSN: [id number] Arrival date & time: 05/10/24  1646      History   Chief Complaint No chief complaint on file.   HPI Danielle Vazquez is a 53 y.o. female.   The history is provided by the patient.   Patient presents with a 2-day history of nausea, vomiting, and right lower quadrant abdominal pain.  Patient states that she has had 1 episode of vomiting today with 3 episodes of diarrhea.  Patient denies fever, chills, gas, bloating, bloody stools, melena stools, or urinary symptoms.  Patient reports that she has taken Phenergan  which she has been prescribed by her PCP in the past.  She also states that she is on pain medicine currently which she has been taking for her symptoms.  She rates her pain 6/10 at present.  Patient reports underlying history of diverticulitis/diverticulosis.  States that her symptoms feel similar to her last episode.  She states it has been several years since she has had a problem with either.  Past Medical History:  Diagnosis Date   Anxiety    Chronic kidney disease    kdiney stone    Chronic pain syndrome    DDD (degenerative disc disease)    has had injections in past   Degenerative disc disease    Diverticulosis    Gastritis    GERD (gastroesophageal reflux disease)    Hiatal hernia    Low back pain    Migraines    Numbness and tingling    Vertigo     Patient Active Problem List   Diagnosis Date Noted   Pelvic pain 04/04/2019   Urinary tract infection without hematuria 04/04/2019   Urinary frequency 04/04/2019   RUQ pain 09/27/2018   Loose stools 01/13/2013   GERD (gastroesophageal reflux disease) 09/03/2011   Helicobacter pylori ab+ 07/22/2011   Hematochezia 07/22/2011   Abdominal pain, other specified site 07/22/2011    Past Surgical History:  Procedure Laterality Date   CHOLECYSTECTOMY  2009   COLONOSCOPY WITH PROPOFOL  N/A 02/01/2013   DOQ:Wnmfjo mucosa in the terminal ileum/Moderate diverticulosis in  the sigmoid colon/ONE RECTAL POLYP REMOVED/Small internal hemorrhoids.  Random colon biopsies negative.  Rectal polyps hyperplastic.  Next colonoscopy in 10 years with MAC   ESOPHAGOGASTRODUODENOSCOPY  April 2013   SLF: esophageal stricture, gastritis, hiatal hernia, negative H.pylori   ESOPHAGOGASTRODUODENOSCOPY (EGD) WITH ESOPHAGEAL DILATION N/A 02/01/2013   DOQ:Dumprulmz at the gastroesophageal junction/MILD Non-erosive gastritis & MODERATE DUODENITIS.  Duodenal biopsies benign.  No abnormalities.  Stomach showed mild chronic inflammation, no H. pylori.  Esophageal biopsies consistent with GERD.   ILEOColonoscopy  09/23/2011   DOQ:Enobed, multiple in the rectum/Polyps, multiple in the distal transverse colon/Diverticulosis,mild,left sided diverticulosis/Internal hemorrhoids/path: tubular adenomas   TUBAL LIGATION  1993   TUBAL LIGATION  1993    OB History     Gravida  3   Para  3   Term  3   Preterm      AB      Living  3      SAB      IAB      Ectopic      Multiple      Live Births  3            Home Medications    Prior to Admission medications   Medication Sig Start Date End Date Taking? Authorizing Provider  allopurinol (ZYLOPRIM) 100 MG tablet Take 100 mg by mouth daily. 05/06/24  Yes [provider]  amoxicillin -clavulanate (AUGMENTIN ) 875-125 MG tablet Take 1 tablet by mouth every 12 (twelve) hours. 05/10/24  Yes Leath-Warren, Etta PARAS, NP  ondansetron  (ZOFRAN -ODT) 4 MG disintegrating tablet Take 1 tablet (4 mg total) by mouth every 8 (eight) hours as needed. 05/10/24  Yes Leath-Warren, Etta PARAS, NP  Vitamin D, Ergocalciferol, (DRISDOL) 1.25 MG (50000 UNIT) CAPS capsule Take 50,000 Units by mouth once a week. 12/15/23  Yes [provider]  albuterol  (VENTOLIN  HFA) 108 (90 Base) MCG/ACT inhaler Inhale 2 puffs into the lungs every 6 (six) hours as needed. 02/24/24   Leath-Warren, Etta PARAS, NP  atorvastatin (LIPITOR) 10 MG tablet Take 10  mg by mouth daily.    [provider]  benzonatate  (TESSALON ) 100 MG capsule Take 1 capsule (100 mg total) by mouth 3 (three) times daily as needed for cough. Do not take with alcohol or while operating or driving heavy machinery 09/23/72   Chandra Harlene LABOR, NP  cetirizine  (ZYRTEC  ALLERGY) 10 MG tablet Take 1 tablet (10 mg total) by mouth daily. 03/15/24   Lavell Bari LABOR, FNP  fluticasone  (FLONASE ) 50 MCG/ACT nasal spray Place 2 sprays into both nostrils daily. 02/24/24   Leath-Warren, Etta PARAS, NP  furosemide  (LASIX ) 20 MG tablet Take 1 tablet (20 mg total) by mouth daily. 01/04/22   Raspet, Erin K, PA-C  gabapentin (NEURONTIN) 300 MG capsule Take 300 mg by mouth 3 (three) times daily as needed (for pain).  03/15/18   [provider]  ibuprofen  (ADVIL ) 800 MG tablet Take 1 tablet (800 mg total) by mouth 3 (three) times daily. 07/28/21   Christopher Savannah, PA-C  meclizine  (ANTIVERT ) 25 MG tablet Take 25 mg by mouth 4 (four) times daily as needed. 10/26/23   [provider]  metFORMIN (GLUCOPHAGE) 500 MG tablet Take 500 mg by mouth 2 (two) times daily. 09/18/23   [provider]  Oxycodone  HCl 10 MG TABS Take 10 mg by mouth 4 (four) times daily as needed. 10/26/20   [provider]  pantoprazole  (PROTONIX ) 20 MG tablet Take 1 tablet (20 mg total) by mouth daily. 10/04/19   Zammit, Joseph, MD  promethazine  (PHENERGAN ) 25 MG tablet Take 25 mg by mouth every 6 (six) hours as needed. 10/19/23   [provider]  promethazine -dextromethorphan (PROMETHAZINE -DM) 6.25-15 MG/5ML syrup Take 5 mLs by mouth 4 (four) times daily as needed. 02/24/24   Leath-Warren, Etta PARAS, NP  SUMAtriptan (IMITREX) 100 MG tablet Take by mouth. 10/19/23   [provider]  zolpidem (AMBIEN) 5 MG tablet Take 5 mg by mouth at bedtime as needed for sleep.    [provider]    Family History Family History  Problem Relation Age of Onset   Heart attack Mother    Heart attack  Father    Colon cancer Neg Hx    Anesthesia problems Neg Hx    Hypotension Neg Hx    Malignant hyperthermia Neg Hx    Pseudochol deficiency Neg Hx     Social History Social History   Tobacco Use   Smoking status: Every Day    Current packs/day: 0.50    Average packs/day: 0.5 packs/day for 25.0 years (12.5 ttl pk-yrs)    Types: Cigarettes   Smokeless tobacco: Never  Vaping Use   Vaping status: Never Used  Substance Use Topics   Alcohol use: Not Currently    Comment: rarely   Drug use: No     Allergies   Hydrocodone , Codeine,  and Latex   Review of Systems Review of Systems Per HPI  Physical Exam Triage Vital Signs ED Triage Vitals  Encounter Vitals Group     BP 05/10/24 1659 112/76     Girls Systolic BP Percentile --      Girls Diastolic BP Percentile --      Boys Systolic BP Percentile --      Boys Diastolic BP Percentile --      Pulse Rate 05/10/24 1659 (!) 110     Resp 05/10/24 1659 16     Temp 05/10/24 1659 97.6 F (36.4 C)     Temp Source 05/10/24 1659 Oral     SpO2 05/10/24 1659 96 %     Weight --      Height --      Head Circumference --      Peak Flow --      Pain Score 05/10/24 1700 6     Pain Loc --      Pain Education --      Exclude from Growth Chart --    No data found.  Updated Vital Signs BP 112/76 (BP Location: Right Arm)   Pulse (!) 110   Temp 97.6 F (36.4 C) (Oral)   Resp 16   LMP 09/09/2016 Comment: neg preg  SpO2 96%   Visual Acuity Right Eye Distance:   Left Eye Distance:   Bilateral Distance:    Right Eye Near:   Left Eye Near:    Bilateral Near:     Physical Exam Vitals and nursing note reviewed.  Constitutional:      General: She is not in acute distress.    Appearance: Normal appearance.  HENT:     Head: Normocephalic.     Mouth/Throat:     Mouth: Mucous membranes are moist.  Eyes:     Extraocular Movements: Extraocular movements intact.     Conjunctiva/sclera: Conjunctivae normal.     Pupils: Pupils are  equal, round, and reactive to light.  Cardiovascular:     Rate and Rhythm: Normal rate and regular rhythm.     Pulses: Normal pulses.     Heart sounds: Normal heart sounds.  Pulmonary:     Effort: Pulmonary effort is normal. No respiratory distress.     Breath sounds: Normal breath sounds. No stridor. No wheezing, rhonchi or rales.  Abdominal:     General: Bowel sounds are normal.     Palpations: Abdomen is soft.     Tenderness: There is no abdominal tenderness. There is no right CVA tenderness, left CVA tenderness, guarding or rebound.  Musculoskeletal:     Cervical back: Normal range of motion.  Skin:    General: Skin is warm and dry.  Neurological:     General: No focal deficit present.     Mental Status: She is alert and oriented to person, place, and time.  Psychiatric:        Mood and Affect: Mood normal.        Behavior: Behavior normal.      UC Treatments / Results  Labs (all labs ordered are listed, but only abnormal results are displayed) Labs Reviewed  POCT URINE DIPSTICK - Abnormal; Notable for the following components:      Result Value   Leukocytes, UA Trace (*)    All other components within normal limits  POC COVID19/FLU A&B COMBO - Normal  URINE CULTURE  COMPREHENSIVE METABOLIC PANEL WITH GFR  CBC WITH DIFFERENTIAL/PLATELET    EKG  Radiology No results found.  Procedures Procedures (including critical care time)  Medications Ordered in UC Medications - No data to display  Initial Impression / Assessment and Plan / UC Course  I have reviewed the triage vital signs and the nursing notes.  Pertinent labs & imaging results that were available during my care of the patient were reviewed by me and considered in my medical decision making (see chart for details).  Urinalysis was negative for signs of infection.  A urine culture is pending.  On exam, patient denies abdominal tenderness.  She has been afebrile since symptoms started.  Given her  underlying history of diverticulosis, labs were collected to include CBC and CMP.  Patient was offered Toradol  injection but declined.  In the interim, we will start patient on Augmentin  875/125 mg to cover for diverticulitis flare.  Symptomatic treatment provided with ondansetron  4 mg ODT for nausea and vomiting.  Supportive care recommendations were provided and discussed with the patient to include fluids, rest, continuing her current pain medications, and a BRAT diet.  Strict ER follow-up precautions were provided and discussed with the patient to include worsening abdominal pain, nausea, vomiting, or the development of fever.  Patient was advised to follow-up with her PCP if symptoms fail to improve.  Patient was in agreement with this plan of care and verbalizes understanding.  All questions were answered.  Patient stable for discharge.  Work note was provided.   Final Clinical Impressions(s) / UC Diagnoses   Final diagnoses:  Abdominal pain, unspecified abdominal location  History of diverticulitis     Discharge Instructions      Lab results are pending.  You will be contacted when the results are received.  You will also have access to the results via MyChart. Take medication as prescribed. Continue your current pain medications. Recommend Pedialyte or Gatorade to prevent dehydration. Recommend a BRAT diet while nausea and vomiting persist.  This includes bananas, rice, applesauce, or toast. Recommend dietary modifications to help decrease your diarrhea symptoms. Go to the emergency department if you experience worsening abdominal pain, nausea, vomiting, or diarrhea, or if you develop new symptoms of fever, chills, or other concerns. Follow-up with your primary care physician or with gastroenterology if your symptoms fail to improve. Follow-up as needed.     ED Prescriptions     Medication Sig Dispense Auth. Provider   amoxicillin -clavulanate (AUGMENTIN ) 875-125 MG tablet Take 1  tablet by mouth every 12 (twelve) hours. 14 tablet Leath-Warren, Etta PARAS, NP   ondansetron  (ZOFRAN -ODT) 4 MG disintegrating tablet Take 1 tablet (4 mg total) by mouth every 8 (eight) hours as needed. 20 tablet Leath-Warren, Etta PARAS, NP      PDMP not reviewed this encounter.   Gilmer Etta PARAS, NP 05/10/24 1727

## 2024-05-10 NOTE — ED Triage Notes (Signed)
 Pt reports to UC with RLQ abdominal pain that hurts when she breaths , n/v, diarrhea x  2 days   Took nausea meds

## 2024-05-11 ENCOUNTER — Ambulatory Visit (HOSPITAL_COMMUNITY): Payer: Self-pay

## 2024-05-11 LAB — COMPREHENSIVE METABOLIC PANEL WITH GFR
ALT: 22 IU/L (ref 0–32)
AST: 18 IU/L (ref 0–40)
Albumin: 4.4 g/dL (ref 3.8–4.9)
Alkaline Phosphatase: 142 IU/L — ABNORMAL HIGH (ref 49–135)
BUN/Creatinine Ratio: 9 (ref 9–23)
BUN: 9 mg/dL (ref 6–24)
Bilirubin Total: 0.4 mg/dL (ref 0.0–1.2)
CO2: 21 mmol/L (ref 20–29)
Calcium: 9.7 mg/dL (ref 8.7–10.2)
Chloride: 102 mmol/L (ref 96–106)
Creatinine, Ser: 0.95 mg/dL (ref 0.57–1.00)
Globulin, Total: 2.9 g/dL (ref 1.5–4.5)
Glucose: 116 mg/dL — ABNORMAL HIGH (ref 70–99)
Potassium: 4.6 mmol/L (ref 3.5–5.2)
Sodium: 140 mmol/L (ref 134–144)
Total Protein: 7.3 g/dL (ref 6.0–8.5)
eGFR: 72 mL/min/1.73 (ref >59)

## 2024-05-11 LAB — CBC WITH DIFFERENTIAL/PLATELET
Basophils Absolute: 0.1 x10E3/uL (ref 0.0–0.2)
Basos: 1 %
EOS (ABSOLUTE): 0.4 x10E3/uL (ref 0.0–0.4)
Eos: 4 %
Hematocrit: 45.4 % (ref 34.0–46.6)
Hemoglobin: 14.8 g/dL (ref 11.1–15.9)
Immature Grans (Abs): 0.1 x10E3/uL (ref 0.0–0.1)
Immature Granulocytes: 1 %
Lymphocytes Absolute: 2.5 x10E3/uL (ref 0.7–3.1)
Lymphs: 24 %
MCH: 30 pg (ref 26.6–33.0)
MCHC: 32.6 g/dL (ref 31.5–35.7)
MCV: 92 fL (ref 79–97)
Monocytes Absolute: 0.7 x10E3/uL (ref 0.1–0.9)
Monocytes: 7 %
Neutrophils Absolute: 6.9 x10E3/uL (ref 1.4–7.0)
Neutrophils: 63 %
Platelets: 352 x10E3/uL (ref 150–450)
RBC: 4.94 x10E6/uL (ref 3.77–5.28)
RDW: 12.8 % (ref 11.7–15.4)
WBC: 10.7 x10E3/uL (ref 3.4–10.8)

## 2024-05-12 LAB — URINE CULTURE

## 2024-05-25 ENCOUNTER — Telehealth: Admitting: Physician Assistant

## 2024-05-25 DIAGNOSIS — B349 Viral infection, unspecified: Secondary | ICD-10-CM

## 2024-05-25 MED ORDER — BENZONATATE 100 MG PO CAPS: 100.0000 mg | ORAL_CAPSULE | Freq: Three times a day (TID) | ORAL | 0 refills | Status: AC | PRN

## 2024-05-25 MED ORDER — ONDANSETRON 4 MG PO TBDP: 4.0000 mg | ORAL_TABLET | Freq: Three times a day (TID) | ORAL | 0 refills | Status: AC | PRN

## 2024-05-25 NOTE — Progress Notes (Signed)
 Virtual Visit Consent   Danielle Vazquez, you are scheduled for a virtual visit with a [facility] provider today. Just as with appointments in the office, your consent must be obtained to participate. Your consent will be active for this visit and any virtual visit you may have with one of our providers in the next 365 days. If you have a MyChart account, a copy of this consent can be sent to you electronically.  As this is a virtual visit, video technology does not allow for your provider to perform a traditional examination. This may limit your provider's ability to fully assess your condition. If your provider identifies any concerns that need to be evaluated in person or the need to arrange testing (such as labs, EKG, etc.), we will make arrangements to do so. Although advances in technology are sophisticated, we cannot ensure that it will always work on either your end or our end. If the connection with a video visit is poor, the visit may have to be switched to a telephone visit. With either a video or telephone visit, we are not always able to ensure that we have a secure connection.  By engaging in this virtual visit, you consent to the provision of healthcare and authorize for your insurance to be billed (if applicable) for the services provided during this visit. Depending on your insurance coverage, you may receive a charge related to this service.  I need to obtain your verbal consent now. Are you willing to proceed with your visit today? Danielle Vazquez has provided verbal consent on 05/25/2024 for a virtual visit (video or telephone). Danielle Vazquez, [place]  Date: 05/25/2024 7:11 PM   Virtual Visit via Video Note   I, Danielle Vazquez, connected with  Danielle Vazquez  ([id number], June 08, 1971) on 05/25/24 at  7:00 PM EST by a video-enabled telemedicine application and verified that I am speaking with the correct person using two identifiers.  Location: Patient: Virtual Visit Location  Patient: Home Provider: Virtual Visit Location Provider: Home Office   I discussed the limitations of evaluation and management by telemedicine and the availability of in person appointments. The patient expressed understanding and agreed to proceed.    History of Present Illness: Danielle Vazquez is a 53 y.o. who identifies as a female who was assigned female at birth, and is being seen today for symptoms starting over the past couple days.  And initially noting some sore throat and a mild cough that was sometimes productive.  As of last night began developing a headache fever (Tmax 101) with abdominal cramping, nausea and nonbloody emesis.  Denies diarrhea or change to bowel habits.  Some decreased appetite along with this.  He denies chest pain or shortness of breath.  Denies any recent travel or known sick contact.  Was treated for suspected diverticulitis about 2-1/2 weeks ago, completing entire course of treatment and noting full resolution of symptoms.  Has been fine up until the past 2 days.  Has not taken a home COVID or flu test.  HPI: HPI  Problems:  Patient Active Problem List   Diagnosis Date Noted   Pelvic pain 04/04/2019   Urinary tract infection without hematuria 04/04/2019   Urinary frequency 04/04/2019   RUQ pain 09/27/2018   Loose stools 01/13/2013   GERD (gastroesophageal reflux disease) 09/03/2011   Helicobacter pylori ab+ 07/22/2011   Hematochezia 07/22/2011   Abdominal pain, other specified site 07/22/2011    Allergies:  Allergies  Allergen  Reactions   Hydrocodone  Nausea Only   Codeine Nausea Only and Rash   Latex Rash   Medications:  Current Outpatient Medications:    benzonatate  (TESSALON ) 100 MG capsule, Take 1 capsule (100 mg total) by mouth 3 (three) times daily as needed for cough., Disp: 30 capsule, Rfl: 0   ondansetron  (ZOFRAN -ODT) 4 MG disintegrating tablet, Take 1 tablet (4 mg total) by mouth every 8 (eight) hours as needed for nausea or vomiting., Disp:  20 tablet, Rfl: 0   albuterol  (VENTOLIN  HFA) 108 (90 Base) MCG/ACT inhaler, Inhale 2 puffs into the lungs every 6 (six) hours as needed., Disp: 8 g, Rfl: 0   allopurinol (ZYLOPRIM) 100 MG tablet, Take 100 mg by mouth daily., Disp: , Rfl:    atorvastatin (LIPITOR) 10 MG tablet, Take 10 mg by mouth daily., Disp: , Rfl:    cetirizine  (ZYRTEC  ALLERGY) 10 MG tablet, Take 1 tablet (10 mg total) by mouth daily., Disp: 90 tablet, Rfl: 1   fluticasone  (FLONASE ) 50 MCG/ACT nasal spray, Place 2 sprays into both nostrils daily., Disp: 16 g, Rfl: 0   furosemide  (LASIX ) 20 MG tablet, Take 1 tablet (20 mg total) by mouth daily., Disp: 5 tablet, Rfl: 0   gabapentin (NEURONTIN) 300 MG capsule, Take 300 mg by mouth 3 (three) times daily as needed (for pain). , Disp: , Rfl:    ibuprofen  (ADVIL ) 800 MG tablet, Take 1 tablet (800 mg total) by mouth 3 (three) times daily., Disp: 30 tablet, Rfl: 0   meclizine  (ANTIVERT ) 25 MG tablet, Take 25 mg by mouth 4 (four) times daily as needed., Disp: , Rfl:    metFORMIN (GLUCOPHAGE) 500 MG tablet, Take 500 mg by mouth 2 (two) times daily., Disp: , Rfl:    Oxycodone  HCl 10 MG TABS, Take 10 mg by mouth 4 (four) times daily as needed., Disp: , Rfl:    pantoprazole  (PROTONIX ) 20 MG tablet, Take 1 tablet (20 mg total) by mouth daily., Disp: 30 tablet, Rfl: 0   SUMAtriptan (IMITREX) 100 MG tablet, Take by mouth., Disp: , Rfl:    Vitamin D, Ergocalciferol, (DRISDOL) 1.25 MG (50000 UNIT) CAPS capsule, Take 50,000 Units by mouth once a week., Disp: , Rfl:    zolpidem (AMBIEN) 5 MG tablet, Take 5 mg by mouth at bedtime as needed for sleep., Disp: , Rfl:   Observations/Objective: Patient is well-developed, well-nourished in no acute distress.  Resting comfortably  at home.  Head is normocephalic, atraumatic.  No labored breathing.  Speech is clear and coherent with logical content.  Patient is alert and oriented at baseline.   Assessment and Plan: 1. Viral illness (Primary) -  benzonatate  (TESSALON ) 100 MG capsule; Take 1 capsule (100 mg total) by mouth 3 (three) times daily as needed for cough.  Dispense: 30 capsule; Refill: 0 - ondansetron  (ZOFRAN -ODT) 4 MG disintegrating tablet; Take 1 tablet (4 mg total) by mouth every 8 (eight) hours as needed for nausea or vomiting.  Dispense: 20 tablet; Refill: 0  Want her to take a home COVID/flu test as a precaution.  Supportive measures and OTC medications reviewed.  Will start Tessalon  for cough.  Tylenol  for headache or sore throat.  Will start Zofran  sublingual to help calm nausea and settle stomach, so that she can stay better hydrated.  Brat diet reviewed.  Will adjust treatment based on her home testing results.  Strict ER precautions reviewed with patient who voiced understanding and agreement with the plan.  Work note provided.  Follow  Up Instructions: I discussed the assessment and treatment plan with the patient. The patient was provided an opportunity to ask questions and all were answered. The patient agreed with the plan and demonstrated an understanding of the instructions.  A copy of instructions were sent to the patient via MyChart unless otherwise noted below.   The patient was advised to call back or seek an in-person evaluation if the symptoms worsen or if the condition fails to improve as anticipated.    Danielle Velma Lunger, PA-C

## 2024-05-25 NOTE — Patient Instructions (Signed)
 Danielle Vazquez, thank you for joining [name], PA-C for today's virtual visit.  While this provider is not your primary care provider (PCP), if your PCP is located in our provider database this encounter information will be shared with them immediately following your visit.   A [facility] MyChart account gives you access to today's visit and all your visits, tests, and labs performed at [facility]  click here if you don't have a [facility] MyChart account or go to [email or web address]  Consent: (Patient) Danielle Vazquez provided verbal consent for this virtual visit at the beginning of the encounter.  Current Medications:  Current Outpatient Medications:    benzonatate  (TESSALON ) 100 MG capsule, Take 1 capsule (100 mg total) by mouth 3 (three) times daily as needed for cough., Disp: 30 capsule, Rfl: 0   ondansetron  (ZOFRAN -ODT) 4 MG disintegrating tablet, Take 1 tablet (4 mg total) by mouth every 8 (eight) hours as needed for nausea or vomiting., Disp: 20 tablet, Rfl: 0   albuterol  (VENTOLIN  HFA) 108 (90 Base) MCG/ACT inhaler, Inhale 2 puffs into the lungs every 6 (six) hours as needed., Disp: 8 g, Rfl: 0   allopurinol (ZYLOPRIM) 100 MG tablet, Take 100 mg by mouth daily., Disp: , Rfl:    atorvastatin (LIPITOR) 10 MG tablet, Take 10 mg by mouth daily., Disp: , Rfl:    cetirizine  (ZYRTEC  ALLERGY) 10 MG tablet, Take 1 tablet (10 mg total) by mouth daily., Disp: 90 tablet, Rfl: 1   fluticasone  (FLONASE ) 50 MCG/ACT nasal spray, Place 2 sprays into both nostrils daily., Disp: 16 g, Rfl: 0   furosemide  (LASIX ) 20 MG tablet, Take 1 tablet (20 mg total) by mouth daily., Disp: 5 tablet, Rfl: 0   gabapentin (NEURONTIN) 300 MG capsule, Take 300 mg by mouth 3 (three) times daily as needed (for pain). , Disp: , Rfl:    ibuprofen  (ADVIL ) 800 MG tablet, Take 1 tablet (800 mg total) by mouth 3 (three) times daily., Disp: 30 tablet, Rfl: 0   meclizine  (ANTIVERT ) 25 MG tablet,  Take 25 mg by mouth 4 (four) times daily as needed., Disp: , Rfl:    metFORMIN (GLUCOPHAGE) 500 MG tablet, Take 500 mg by mouth 2 (two) times daily., Disp: , Rfl:    Oxycodone  HCl 10 MG TABS, Take 10 mg by mouth 4 (four) times daily as needed., Disp: , Rfl:    pantoprazole  (PROTONIX ) 20 MG tablet, Take 1 tablet (20 mg total) by mouth daily., Disp: 30 tablet, Rfl: 0   SUMAtriptan (IMITREX) 100 MG tablet, Take by mouth., Disp: , Rfl:    Vitamin D, Ergocalciferol, (DRISDOL) 1.25 MG (50000 UNIT) CAPS capsule, Take 50,000 Units by mouth once a week., Disp: , Rfl:    zolpidem (AMBIEN) 5 MG tablet, Take 5 mg by mouth at bedtime as needed for sleep., Disp: , Rfl:    Medications ordered in this encounter:  Meds ordered this encounter  Medications   benzonatate  (TESSALON ) 100 MG capsule    Sig: Take 1 capsule (100 mg total) by mouth 3 (three) times daily as needed for cough.    Dispense:  30 capsule    Refill:  0    Supervising Provider:   LAMPTEY, PHILIP O [[id number]]   ondansetron  (ZOFRAN -ODT) 4 MG disintegrating tablet    Sig: Take 1 tablet (4 mg total) by mouth every 8 (eight) hours as needed for nausea or vomiting.    Dispense:  20 tablet    Refill:  0  Supervising Provider:   BLAISE ALEENE KIDD [[id number]]     *If you need refills on other medications prior to your next appointment, please contact your pharmacy*  Follow-Up: Call back or seek an in-person evaluation if the symptoms worsen or if the condition fails to improve as anticipated.  [facility] [phone]  Other Instructions Please take a home COVID/flu test as discussed. Continue to take sips of water , ginger ale or low sugar electrolyte water . Once your appetite begins to return, please follow the dietary recommendations below. Use the Zofran  as directed to calm nausea and prevent vomiting. The Tessalon  is to help with cough. Tylenol  OTC as needed for headache, body aches. Avoid medicines like ibuprofen  or  Aleve  as these can irritate the lining of your stomach. If anything acutely worsens despite treatment tonight, you need to seek an in person evaluation ASAP.  Do not delay care.   If you have been instructed to have an in-person evaluation today at a local Urgent Care facility, please use the link below. It will take you to a list of all of our available [facility], including address, phone number and hours of operation. Please do not delay care.  [facility]  If you or a family member do not have a primary care provider, use the link below to schedule a visit and establish care. When you choose a [facility] primary care physician or advanced practice provider, you gain a long-term partner in health. Find a Primary Care Provider  Learn more about [facility]'s in-office and virtual care options: [facility] - Get Care Now

## 2024-05-26 ENCOUNTER — Other Ambulatory Visit: Payer: Self-pay

## 2024-05-26 ENCOUNTER — Inpatient Hospital Stay: Admission: RE | Admit: 2024-05-26 | Discharge: 2024-05-26 | Attending: Family Medicine

## 2024-05-26 VITALS — BP 122/88 | HR 102 | Temp 97.8°F | Resp 18

## 2024-05-26 DIAGNOSIS — J069 Acute upper respiratory infection, unspecified: Secondary | ICD-10-CM

## 2024-05-26 LAB — POC COVID19/FLU A&B COMBO
Covid Antigen, POC: NEGATIVE
Influenza A Antigen, POC: NEGATIVE
Influenza B Antigen, POC: NEGATIVE

## 2024-05-26 MED ORDER — AZELASTINE HCL 0.1 % NA SOLN: 1.0000 | Freq: Two times a day (BID) | NASAL | 0 refills | Status: AC

## 2024-05-26 MED ORDER — PROMETHAZINE-DM 6.25-15 MG/5ML PO SYRP: 5.0000 mL | ORAL_SOLUTION | Freq: Four times a day (QID) | ORAL | 0 refills | Status: AC | PRN

## 2024-05-26 NOTE — ED Provider Notes (Signed)
 [facility]    CSN: [id number] Arrival date & time: 05/26/24  0857      History   Chief Complaint Chief Complaint  Patient presents with   Cough    Cough sore throat headache fever chills and nothing taste right - Entered by patient    HPI Danielle Vazquez is a 53 y.o. female.   Patient presenting today with several day history of sore throat, cough, headache, nausea, congestion, fatigue.  Denies fever, chest pain, shortness of breath, abdominal pain, vomiting, diarrhea.  So far trying over-the-counter cough congestion medication with minimal relief.  Tried a virtual visit with primary care who recommended she come in in person to be tested for COVID and flu.    Past Medical History:  Diagnosis Date   Anxiety    Chronic kidney disease    kdiney stone    Chronic pain syndrome    DDD (degenerative disc disease)    has had injections in past   Degenerative disc disease    Diverticulosis    Gastritis    GERD (gastroesophageal reflux disease)    Hiatal hernia    Low back pain    Migraines    Numbness and tingling    Vertigo     Patient Active Problem List   Diagnosis Date Noted   Pelvic pain 04/04/2019   Urinary tract infection without hematuria 04/04/2019   Urinary frequency 04/04/2019   RUQ pain 09/27/2018   Loose stools 01/13/2013   GERD (gastroesophageal reflux disease) 09/03/2011   Helicobacter pylori ab+ 07/22/2011   Hematochezia 07/22/2011   Abdominal pain, other specified site 07/22/2011    Past Surgical History:  Procedure Laterality Date   CHOLECYSTECTOMY  2009   COLONOSCOPY WITH PROPOFOL  N/A 02/01/2013   DOQ:Wnmfjo mucosa in the terminal ileum/Moderate diverticulosis in the sigmoid colon/ONE RECTAL POLYP REMOVED/Small internal hemorrhoids.  Random colon biopsies negative.  Rectal polyps hyperplastic.  Next colonoscopy in 10 years with MAC   ESOPHAGOGASTRODUODENOSCOPY  April 2013   SLF: esophageal stricture, gastritis, hiatal hernia,  negative H.pylori   ESOPHAGOGASTRODUODENOSCOPY (EGD) WITH ESOPHAGEAL DILATION N/A 02/01/2013   DOQ:Dumprulmz at the gastroesophageal junction/MILD Non-erosive gastritis & MODERATE DUODENITIS.  Duodenal biopsies benign.  No abnormalities.  Stomach showed mild chronic inflammation, no H. pylori.  Esophageal biopsies consistent with GERD.   ILEOColonoscopy  09/23/2011   DOQ:Enobed, multiple in the rectum/Polyps, multiple in the distal transverse colon/Diverticulosis,mild,left sided diverticulosis/Internal hemorrhoids/path: tubular adenomas   TUBAL LIGATION  1993   TUBAL LIGATION  1993    OB History     Gravida  3   Para  3   Term  3   Preterm      AB      Living  3      SAB      IAB      Ectopic      Multiple      Live Births  3            Home Medications    Prior to Admission medications   Medication Sig Start Date End Date Taking? Authorizing Provider  azelastine  (ASTELIN ) 0.1 % nasal spray Place 1 spray into both nostrils 2 (two) times daily. Use in each nostril as directed 05/26/24  Yes Stuart Vernell Norris, PA-C  promethazine -dextromethorphan (PROMETHAZINE -DM) 6.25-15 MG/5ML syrup Take 5 mLs by mouth 4 (four) times daily as needed. 05/26/24  Yes Stuart Vernell Norris, PA-C  albuterol  (VENTOLIN  HFA) 108 [phone] Base) MCG/ACT inhaler Inhale 2  puffs into the lungs every 6 (six) hours as needed. 02/24/24   Leath-Warren, Etta PARAS, NP  allopurinol (ZYLOPRIM) 100 MG tablet Take 100 mg by mouth daily. 05/06/24   [provider]  atorvastatin (LIPITOR) 10 MG tablet Take 10 mg by mouth daily.    [provider]  benzonatate  (TESSALON ) 100 MG capsule Take 1 capsule (100 mg total) by mouth 3 (three) times daily as needed for cough. Patient not taking: Reported on 05/26/2024 05/25/24   Gladis Elsie BROCKS, PA-C  cetirizine  (ZYRTEC  ALLERGY) 10 MG tablet Take 1 tablet (10 mg total) by mouth daily. 03/15/24   Lavell Bari LABOR, FNP  fluticasone  (FLONASE ) 50 MCG/ACT  nasal spray Place 2 sprays into both nostrils daily. 02/24/24   Leath-Warren, Etta PARAS, NP  furosemide  (LASIX ) 20 MG tablet Take 1 tablet (20 mg total) by mouth daily. 01/04/22   Raspet, Erin K, PA-C  gabapentin (NEURONTIN) 300 MG capsule Take 300 mg by mouth 3 (three) times daily as needed (for pain).  03/15/18   [provider]  ibuprofen  (ADVIL ) 800 MG tablet Take 1 tablet (800 mg total) by mouth 3 (three) times daily. 07/28/21   Christopher Savannah, PA-C  meclizine  (ANTIVERT ) 25 MG tablet Take 25 mg by mouth 4 (four) times daily as needed. 10/26/23   [provider]  metFORMIN (GLUCOPHAGE) 500 MG tablet Take 500 mg by mouth 2 (two) times daily. 09/18/23   [provider]  ondansetron  (ZOFRAN -ODT) 4 MG disintegrating tablet Take 1 tablet (4 mg total) by mouth every 8 (eight) hours as needed for nausea or vomiting. Patient not taking: Reported on 05/26/2024 05/25/24   Gladis Elsie BROCKS, PA-C  Oxycodone  HCl 10 MG TABS Take 10 mg by mouth 4 (four) times daily as needed. 10/26/20   [provider]  pantoprazole  (PROTONIX ) 20 MG tablet Take 1 tablet (20 mg total) by mouth daily. 10/04/19   Suzette Pac, MD  SUMAtriptan (IMITREX) 100 MG tablet Take by mouth. 10/19/23   [provider]  Vitamin D, Ergocalciferol, (DRISDOL) 1.25 MG (50000 UNIT) CAPS capsule Take 50,000 Units by mouth once a week. 12/15/23   [provider]  zolpidem (AMBIEN) 5 MG tablet Take 5 mg by mouth at bedtime as needed for sleep.    [provider]    Family History Family History  Problem Relation Age of Onset   Heart attack Mother    Heart attack Father    Colon cancer Neg Hx    Anesthesia problems Neg Hx    Hypotension Neg Hx    Malignant hyperthermia Neg Hx    Pseudochol deficiency Neg Hx     Social History Social History   Tobacco Use   Smoking status: Every Day    Current packs/day: 0.50    Average packs/day: 0.5 packs/day for 25.0 years (12.5 ttl pk-yrs)    Types:  Cigarettes   Smokeless tobacco: Never  Vaping Use   Vaping status: Never Used  Substance Use Topics   Alcohol use: Not Currently    Comment: rarely   Drug use: No     Allergies   Hydrocodone , Codeine, and Latex   Review of Systems Review of Systems PER HPI  Physical Exam Triage Vital Signs ED Triage Vitals  Encounter Vitals Group     BP 05/26/24 0900 122/88     Girls Systolic BP Percentile --      Girls Diastolic BP Percentile --      Boys Systolic BP Percentile --  Boys Diastolic BP Percentile --      Pulse Rate 05/26/24 0900 (!) 102     Resp 05/26/24 0900 18     Temp 05/26/24 0900 97.8 F (36.6 C)     Temp Source 05/26/24 0900 Oral     SpO2 05/26/24 0900 96 %     Weight --      Height --      Head Circumference --      Peak Flow --      Pain Score 05/26/24 0902 7     Pain Loc --      Pain Education --      Exclude from Growth Chart --    No data found.  Updated Vital Signs BP 122/88 (BP Location: Right Arm)   Pulse (!) 102   Temp 97.8 F (36.6 C) (Oral)   Resp 18   LMP 09/09/2016 Comment: neg preg  SpO2 96%   Visual Acuity Right Eye Distance:   Left Eye Distance:   Bilateral Distance:    Right Eye Near:   Left Eye Near:    Bilateral Near:     Physical Exam Vitals and nursing note reviewed.  Constitutional:      Appearance: Normal appearance.  HENT:     Head: Atraumatic.     Right Ear: Tympanic membrane and external ear normal.     Left Ear: Tympanic membrane and external ear normal.     Nose: Rhinorrhea present.     Mouth/Throat:     Mouth: Mucous membranes are moist.     Pharynx: Posterior oropharyngeal erythema present.  Eyes:     Extraocular Movements: Extraocular movements intact.     Conjunctiva/sclera: Conjunctivae normal.  Cardiovascular:     Rate and Rhythm: Normal rate and regular rhythm.     Heart sounds: Normal heart sounds.  Pulmonary:     Effort: Pulmonary effort is normal.     Breath sounds: Normal breath sounds.  No wheezing or rales.  Musculoskeletal:        General: Normal range of motion.     Cervical back: Normal range of motion and neck supple.  Skin:    General: Skin is warm and dry.  Neurological:     Mental Status: She is alert and oriented to person, place, and time.  Psychiatric:        Mood and Affect: Mood normal.        Thought Content: Thought content normal.      UC Treatments / Results  Labs (all labs ordered are listed, but only abnormal results are displayed) Labs Reviewed  POC COVID19/FLU A&B COMBO - Normal    EKG   Radiology No results found.  Procedures Procedures (including critical care time)  Medications Ordered in UC Medications - No data to display  Initial Impression / Assessment and Plan / UC Course  I have reviewed the triage vital signs and the nursing notes.  Pertinent labs & imaging results that were available during my care of the patient were reviewed by me and considered in my medical decision making (see chart for details).     Overall vitals and exam reassuring today, suspicious for viral respiratory infection.  Rapid flu and COVID-negative.  Treat symptomatically with Phenergan  DM, Astelin, supportive over-the-counter medications and home care.  Work note given.  Return for worsening or unresolving symptoms.  Final Clinical Impressions(s) / UC Diagnoses   Final diagnoses:  Viral URI with cough   Discharge Instructions  None    ED Prescriptions     Medication Sig Dispense Auth. Provider   promethazine -dextromethorphan (PROMETHAZINE -DM) 6.25-15 MG/5ML syrup Take 5 mLs by mouth 4 (four) times daily as needed. 100 mL Stuart Vernell Norris, PA-C   azelastine (ASTELIN) 0.1 % nasal spray Place 1 spray into both nostrils 2 (two) times daily. Use in each nostril as directed 30 mL Stuart Vernell Norris, PA-C      PDMP not reviewed this encounter.   Stuart Vernell Norris, [place] 05/26/24 1459

## 2024-05-26 NOTE — ED Triage Notes (Signed)
 Pt reports sore throat, cough, headache, nausea for last several days. Has tried otc medication with minimal relief. Pt had virtual visit and recommended pt come in and be tested for covid/flu.

## 2024-06-08 ENCOUNTER — Ambulatory Visit
Admission: EM | Admit: 2024-06-08 | Discharge: 2024-06-08 | Disposition: A | Discharge status: Home / Self Care | Source: Home / Self Care

## 2024-06-08 ENCOUNTER — Encounter: Payer: Self-pay | Admitting: Emergency Medicine

## 2024-06-08 DIAGNOSIS — N390 Urinary tract infection, site not specified: Secondary | ICD-10-CM

## 2024-06-08 LAB — POCT URINE DIPSTICK
Bilirubin, UA: NEGATIVE
Blood, UA: NEGATIVE
Glucose, UA: NEGATIVE mg/dL
Ketones, POC UA: NEGATIVE mg/dL
Nitrite, UA: NEGATIVE
POC PROTEIN,UA: NEGATIVE
Spec Grav, UA: 1.02 (ref 1.010–1.025)
Urobilinogen, UA: 1 U/dL
pH, UA: 6 (ref 5.0–8.0)

## 2024-06-08 MED ORDER — PHENAZOPYRIDINE HCL 200 MG PO TABS: 200.0000 mg | ORAL_TABLET | Freq: Three times a day (TID) | ORAL | 0 refills | Status: AC | PRN

## 2024-06-08 MED ORDER — CEPHALEXIN 500 MG PO CAPS: 500.0000 mg | ORAL_CAPSULE | Freq: Two times a day (BID) | ORAL | 0 refills | Status: AC

## 2024-06-08 NOTE — ED Provider Notes (Signed)
 [facility]    CSN: [id number] Arrival date & time: 06/08/24  1414      History   Chief Complaint No chief complaint on file.   HPI Danielle Vazquez is a 53 y.o. female.   Patient presenting today with 1 day history of lower abdominal discomfort, dysuria, nausea.  Denies fever, chills, vomiting, diarrhea, constipation, hematuria, vaginal symptoms.  So far not trying anything over-the-counter for symptoms.  History of UTIs that felt similar.    Past Medical History:  Diagnosis Date   Anxiety    Chronic kidney disease    kdiney stone    Chronic pain syndrome    DDD (degenerative disc disease)    has had injections in past   Degenerative disc disease    Diverticulosis    Gastritis    GERD (gastroesophageal reflux disease)    Hiatal hernia    Low back pain    Migraines    Numbness and tingling    Vertigo     Patient Active Problem List   Diagnosis Date Noted   Pelvic pain 04/04/2019   Urinary tract infection without hematuria 04/04/2019   Urinary frequency 04/04/2019   RUQ pain 09/27/2018   Loose stools 01/13/2013   GERD (gastroesophageal reflux disease) 09/03/2011   Helicobacter pylori ab+ 07/22/2011   Hematochezia 07/22/2011   Abdominal pain, other specified site 07/22/2011    Past Surgical History:  Procedure Laterality Date   CHOLECYSTECTOMY  2009   COLONOSCOPY WITH PROPOFOL  N/A 02/01/2013   DOQ:Wnmfjo mucosa in the terminal ileum/Moderate diverticulosis in the sigmoid colon/ONE RECTAL POLYP REMOVED/Small internal hemorrhoids.  Random colon biopsies negative.  Rectal polyps hyperplastic.  Next colonoscopy in 10 years with MAC   ESOPHAGOGASTRODUODENOSCOPY  April 2013   SLF: esophageal stricture, gastritis, hiatal hernia, negative H.pylori   ESOPHAGOGASTRODUODENOSCOPY (EGD) WITH ESOPHAGEAL DILATION N/A 02/01/2013   DOQ:Dumprulmz at the gastroesophageal junction/MILD Non-erosive gastritis & MODERATE DUODENITIS.  Duodenal biopsies benign.  No  abnormalities.  Stomach showed mild chronic inflammation, no H. pylori.  Esophageal biopsies consistent with GERD.   ILEOColonoscopy  09/23/2011   DOQ:Enobed, multiple in the rectum/Polyps, multiple in the distal transverse colon/Diverticulosis,mild,left sided diverticulosis/Internal hemorrhoids/path: tubular adenomas   TUBAL LIGATION  1993   TUBAL LIGATION  1993    OB History     Gravida  3   Para  3   Term  3   Preterm      AB      Living  3      SAB      IAB      Ectopic      Multiple      Live Births  3            Home Medications    Prior to Admission medications  Medication Sig Start Date End Date Taking? Authorizing Provider  cephALEXin  (KEFLEX ) 500 MG capsule Take 1 capsule (500 mg total) by mouth 2 (two) times daily. 06/08/24  Yes Stuart Vernell Norris, PA-C  phenazopyridine  (PYRIDIUM ) 200 MG tablet Take 1 tablet (200 mg total) by mouth 3 (three) times daily as needed for pain. 06/08/24  Yes Stuart Vernell Norris, PA-C  albuterol  (VENTOLIN  HFA) 108 (90 Base) MCG/ACT inhaler Inhale 2 puffs into the lungs every 6 (six) hours as needed. 02/24/24   Leath-Warren, Etta PARAS, NP  allopurinol (ZYLOPRIM) 100 MG tablet Take 100 mg by mouth daily. 05/06/24   [provider]  atorvastatin (LIPITOR) 10 MG tablet Take 10 mg  by mouth daily.    [provider]  azelastine  (ASTELIN ) 0.1 % nasal spray Place 1 spray into both nostrils 2 (two) times daily. Use in each nostril as directed 05/26/24   Stuart Vernell Norris, PA-C  benzonatate  (TESSALON ) 100 MG capsule Take 1 capsule (100 mg total) by mouth 3 (three) times daily as needed for cough. Patient not taking: Reported on 05/26/2024 05/25/24   Gladis Elsie BROCKS, PA-C  cetirizine  (ZYRTEC  ALLERGY) 10 MG tablet Take 1 tablet (10 mg total) by mouth daily. 03/15/24   Lavell Bari LABOR, FNP  fluticasone  (FLONASE ) 50 MCG/ACT nasal spray Place 2 sprays into both nostrils daily. 02/24/24   Leath-Warren, Etta PARAS, NP   furosemide  (LASIX ) 20 MG tablet Take 1 tablet (20 mg total) by mouth daily. 01/04/22   Raspet, Erin K, PA-C  gabapentin (NEURONTIN) 300 MG capsule Take 300 mg by mouth 3 (three) times daily as needed (for pain).  03/15/18   [provider]  ibuprofen  (ADVIL ) 800 MG tablet Take 1 tablet (800 mg total) by mouth 3 (three) times daily. 07/28/21   Christopher Savannah, PA-C  meclizine  (ANTIVERT ) 25 MG tablet Take 25 mg by mouth 4 (four) times daily as needed. 10/26/23   [provider]  metFORMIN (GLUCOPHAGE) 500 MG tablet Take 500 mg by mouth 2 (two) times daily. 09/18/23   [provider]  ondansetron  (ZOFRAN -ODT) 4 MG disintegrating tablet Take 1 tablet (4 mg total) by mouth every 8 (eight) hours as needed for nausea or vomiting. Patient not taking: Reported on 05/26/2024 05/25/24   Gladis Elsie BROCKS, PA-C  Oxycodone  HCl 10 MG TABS Take 10 mg by mouth 4 (four) times daily as needed. 10/26/20   [provider]  pantoprazole  (PROTONIX ) 20 MG tablet Take 1 tablet (20 mg total) by mouth daily. 10/04/19   Zammit, Joseph, MD  promethazine -dextromethorphan (PROMETHAZINE -DM) 6.25-15 MG/5ML syrup Take 5 mLs by mouth 4 (four) times daily as needed. 05/26/24   Stuart Vernell Norris, PA-C  SUMAtriptan (IMITREX) 100 MG tablet Take by mouth. 10/19/23   [provider]  Vitamin D, Ergocalciferol, (DRISDOL) 1.25 MG (50000 UNIT) CAPS capsule Take 50,000 Units by mouth once a week. 12/15/23   [provider]  zolpidem (AMBIEN) 5 MG tablet Take 5 mg by mouth at bedtime as needed for sleep.    [provider]    Family History Family History  Problem Relation Age of Onset   Heart attack Mother    Heart attack Father    Colon cancer Neg Hx    Anesthesia problems Neg Hx    Hypotension Neg Hx    Malignant hyperthermia Neg Hx    Pseudochol deficiency Neg Hx     Social History Social History[1]   Allergies   Hydrocodone , Codeine, and Latex   Review of Systems Review  of Systems Per HPI  Physical Exam Triage Vital Signs ED Triage Vitals  Encounter Vitals Group     BP 06/08/24 1447 111/78     Girls Systolic BP Percentile --      Girls Diastolic BP Percentile --      Boys Systolic BP Percentile --      Boys Diastolic BP Percentile --      Pulse Rate 06/08/24 1447 96     Resp 06/08/24 1447 18     Temp 06/08/24 1447 98.1 F (36.7 C)     Temp Source 06/08/24 1447 Oral     SpO2 06/08/24 1447 92 %  Weight --      Height --      Head Circumference --      Peak Flow --      Pain Score 06/08/24 1448 8     Pain Loc --      Pain Education --      Exclude from Growth Chart --    No data found.  Updated Vital Signs BP 111/78 (BP Location: Right Arm)   Pulse 96   Temp 98.1 F (36.7 C) (Oral)   Resp 18   LMP 09/09/2016 Comment: neg preg  SpO2 92%   Visual Acuity Right Eye Distance:   Left Eye Distance:   Bilateral Distance:    Right Eye Near:   Left Eye Near:    Bilateral Near:     Physical Exam Vitals and nursing note reviewed.  Constitutional:      Appearance: Normal appearance. She is not ill-appearing.  HENT:     Head: Atraumatic.  Eyes:     Extraocular Movements: Extraocular movements intact.     Conjunctiva/sclera: Conjunctivae normal.  Cardiovascular:     Rate and Rhythm: Normal rate.  Pulmonary:     Effort: Pulmonary effort is normal.  Abdominal:     General: Bowel sounds are normal. There is no distension.     Palpations: Abdomen is soft.     Tenderness: There is no abdominal tenderness. There is no right CVA tenderness, left CVA tenderness or guarding.  Musculoskeletal:        General: Normal range of motion.     Cervical back: Normal range of motion and neck supple.  Skin:    General: Skin is warm and dry.  Neurological:     Mental Status: She is alert and oriented to person, place, and time.  Psychiatric:        Mood and Affect: Mood normal.        Thought Content: Thought content normal.        Judgment:  Judgment normal.      UC Treatments / Results  Labs (all labs ordered are listed, but only abnormal results are displayed) Labs Reviewed  POCT URINE DIPSTICK - Abnormal; Notable for the following components:      Result Value   Leukocytes, UA Small (1+) (*)    All other components within normal limits  URINE CULTURE    EKG   Radiology No results found.  Procedures Procedures (including critical care time)  Medications Ordered in UC Medications - No data to display  Initial Impression / Assessment and Plan / UC Course  I have reviewed the triage vital signs and the nursing notes.  Pertinent labs & imaging results that were available during my care of the patient were reviewed by me and considered in my medical decision making (see chart for details).     Vitals and exam reassuring, urinalysis with evidence of a urinary tract infection.  Treat with Keflex , await urine culture.  Prodium as needed for burning.  Return for worsening or unresolving symptoms.  Final Clinical Impressions(s) / UC Diagnoses   Final diagnoses:  Acute lower UTI   Discharge Instructions   None    ED Prescriptions     Medication Sig Dispense Auth. Provider   cephALEXin  (KEFLEX ) 500 MG capsule Take 1 capsule (500 mg total) by mouth 2 (two) times daily. 10 capsule Stuart Vernell Norris, PA-C   phenazopyridine  (PYRIDIUM ) 200 MG tablet Take 1 tablet (200 mg total) by mouth 3 (three) times  daily as needed for pain. 6 tablet Stuart Vernell Norris, [place]      PDMP not reviewed this encounter.    [1]  Social History Tobacco Use   Smoking status: Every Day    Current packs/day: 0.50    Average packs/day: 0.5 packs/day for 25.0 years (12.5 ttl pk-yrs)    Types: Cigarettes   Smokeless tobacco: Never  Vaping Use   Vaping status: Never Used  Substance Use Topics   Alcohol use: Not Currently    Comment: rarely   Drug use: No     Stuart Vernell Norris, PA-C 06/08/24 1617

## 2024-06-08 NOTE — ED Triage Notes (Signed)
 Lower left abd pain that radiates across ABD since yesterday.  States she feels nauseated.  Last BM was this morning.  States pain on urination

## 2024-06-09 ENCOUNTER — Telehealth: Payer: Self-pay

## 2024-06-09 NOTE — Telephone Encounter (Signed)
 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

## 2024-06-10 ENCOUNTER — Ambulatory Visit (HOSPITAL_COMMUNITY): Payer: Self-pay

## 2024-06-10 LAB — URINE CULTURE: Culture: <10000 — AB

## 2024-06-13 ENCOUNTER — Other Ambulatory Visit (HOSPITAL_COMMUNITY): Payer: Self-pay | Admitting: Internal Medicine

## 2024-06-13 DIAGNOSIS — Z1231 Encounter for screening mammogram for malignant neoplasm of breast: Secondary | ICD-10-CM

## 2024-06-29 ENCOUNTER — Ambulatory Visit (HOSPITAL_COMMUNITY)

## 2024-06-29 ENCOUNTER — Other Ambulatory Visit (HOSPITAL_COMMUNITY)

## 2024-06-29 ENCOUNTER — Encounter (HOSPITAL_COMMUNITY): Payer: Self-pay

## 2024-07-11 ENCOUNTER — Ambulatory Visit (HOSPITAL_COMMUNITY)
Admission: RE | Admit: 2024-07-11 | Discharge: 2024-07-11 | Disposition: A | Source: Ambulatory Visit | Attending: Internal Medicine | Admitting: Internal Medicine

## 2024-07-11 ENCOUNTER — Encounter (HOSPITAL_COMMUNITY): Payer: Self-pay

## 2024-07-11 ENCOUNTER — Ambulatory Visit (HOSPITAL_COMMUNITY)
Admission: RE | Admit: 2024-07-11 | Discharge: 2024-07-11 | Disposition: A | Discharge status: Home / Self Care | Source: Ambulatory Visit | Attending: Internal Medicine | Admitting: Internal Medicine

## 2024-07-11 DIAGNOSIS — Z720 Tobacco use: Secondary | ICD-10-CM | POA: Insufficient documentation

## 2024-07-11 DIAGNOSIS — Z780 Asymptomatic menopausal state: Secondary | ICD-10-CM

## 2024-07-11 DIAGNOSIS — Z122 Encounter for screening for malignant neoplasm of respiratory organs: Secondary | ICD-10-CM | POA: Insufficient documentation

## 2024-07-11 DIAGNOSIS — Z1231 Encounter for screening mammogram for malignant neoplasm of breast: Secondary | ICD-10-CM | POA: Diagnosis present

## 2024-07-12 ENCOUNTER — Ambulatory Visit (HOSPITAL_COMMUNITY): Admission: RE | Admit: 2024-07-12 | Source: Ambulatory Visit

## 2024-07-20 ENCOUNTER — Emergency Department (HOSPITAL_COMMUNITY)
Admission: EM | Admit: 2024-07-20 | Discharge: 2024-07-20 | Discharge status: Against Medical Advice | Attending: Emergency Medicine | Admitting: Emergency Medicine

## 2024-07-20 ENCOUNTER — Encounter (HOSPITAL_COMMUNITY): Payer: Self-pay

## 2024-07-20 ENCOUNTER — Other Ambulatory Visit: Payer: Self-pay

## 2024-07-20 DIAGNOSIS — Z5321 Procedure and treatment not carried out due to patient leaving prior to being seen by health care provider: Secondary | ICD-10-CM | POA: Diagnosis not present

## 2024-07-20 DIAGNOSIS — R1110 Vomiting, unspecified: Secondary | ICD-10-CM | POA: Diagnosis not present

## 2024-07-20 DIAGNOSIS — M545 Low back pain, unspecified: Secondary | ICD-10-CM | POA: Diagnosis present

## 2024-07-20 NOTE — ED Triage Notes (Signed)
 Pt reports left side lower back pain that hurts when she tries to bend over or raise her leg up.  Pt reports pain is making her vomit.

## 2024-07-21 ENCOUNTER — Emergency Department (HOSPITAL_COMMUNITY)

## 2024-07-21 ENCOUNTER — Other Ambulatory Visit: Payer: Self-pay

## 2024-07-21 ENCOUNTER — Emergency Department (HOSPITAL_COMMUNITY)
Admission: EM | Admit: 2024-07-21 | Discharge: 2024-07-21 | Disposition: A | Discharge status: Home / Self Care | Attending: Emergency Medicine | Admitting: Emergency Medicine

## 2024-07-21 ENCOUNTER — Encounter (HOSPITAL_COMMUNITY): Payer: Self-pay

## 2024-07-21 DIAGNOSIS — R079 Chest pain, unspecified: Secondary | ICD-10-CM | POA: Diagnosis present

## 2024-07-21 DIAGNOSIS — N189 Chronic kidney disease, unspecified: Secondary | ICD-10-CM | POA: Diagnosis not present

## 2024-07-21 DIAGNOSIS — R000 Tachycardia, unspecified: Secondary | ICD-10-CM | POA: Diagnosis not present

## 2024-07-21 DIAGNOSIS — F17210 Nicotine dependence, cigarettes, uncomplicated: Secondary | ICD-10-CM | POA: Diagnosis not present

## 2024-07-21 DIAGNOSIS — K5732 Diverticulitis of large intestine without perforation or abscess without bleeding: Secondary | ICD-10-CM | POA: Diagnosis not present

## 2024-07-21 DIAGNOSIS — K5792 Diverticulitis of intestine, part unspecified, without perforation or abscess without bleeding: Secondary | ICD-10-CM

## 2024-07-21 LAB — COMPREHENSIVE METABOLIC PANEL WITH GFR
ALT: 20 U/L (ref 0–44)
AST: 22 U/L (ref 15–41)
Albumin: 4 g/dL (ref 3.5–5.0)
Alkaline Phosphatase: 123 U/L (ref 38–126)
Anion gap: 12 (ref 5–15)
BUN: 11 mg/dL (ref 6–20)
CO2: 26 mmol/L (ref 22–32)
Calcium: 9.3 mg/dL (ref 8.9–10.3)
Chloride: 104 mmol/L (ref 98–111)
Creatinine, Ser: 0.81 mg/dL (ref 0.44–1.00)
GFR, Estimated: >60 mL/min
Glucose, Bld: 206 mg/dL — ABNORMAL HIGH (ref 70–99)
Potassium: 4.1 mmol/L (ref 3.5–5.1)
Sodium: 141 mmol/L (ref 135–145)
Total Bilirubin: 0.4 mg/dL (ref 0.0–1.2)
Total Protein: 7 g/dL (ref 6.5–8.1)

## 2024-07-21 LAB — TROPONIN T, HIGH SENSITIVITY
Troponin T High Sensitivity: <6 ng/L (ref 0–19)
Troponin T High Sensitivity: 6 ng/L (ref 0–19)

## 2024-07-21 LAB — CBC
HCT: 40.7 % (ref 36.0–46.0)
Hemoglobin: 13.1 g/dL (ref 12.0–15.0)
MCH: 29.2 pg (ref 26.0–34.0)
MCHC: 32.2 g/dL (ref 30.0–36.0)
MCV: 90.8 fL (ref 80.0–100.0)
Platelets: 322 10*3/uL (ref 150–400)
RBC: 4.48 MIL/uL (ref 3.87–5.11)
RDW: 13.9 % (ref 11.5–15.5)
WBC: 7.3 10*3/uL (ref 4.0–10.5)
nRBC: 0 % (ref 0.0–0.2)

## 2024-07-21 LAB — LIPASE, BLOOD: Lipase: 15 U/L (ref 11–51)

## 2024-07-21 MED ORDER — AMOXICILLIN-POT CLAVULANATE 875-125 MG PO TABS: 1.0000 | ORAL_TABLET | Freq: Two times a day (BID) | ORAL | 0 refills | Status: AC

## 2024-07-21 MED ORDER — ONDANSETRON HCL 4 MG/2ML IJ SOLN
4.0000 mg | Freq: Once | INTRAMUSCULAR | Status: AC
  Administered 2024-07-21: 4 mg via INTRAVENOUS
  Filled 2024-07-21: qty 2

## 2024-07-21 MED ORDER — NAPROXEN 500 MG PO TABS: 500.0000 mg | ORAL_TABLET | Freq: Two times a day (BID) | ORAL | 0 refills | Status: AC

## 2024-07-21 MED ORDER — HYDROMORPHONE HCL 1 MG/ML IJ SOLN
1.0000 mg | Freq: Once | INTRAMUSCULAR | Status: AC
  Administered 2024-07-21: 1 mg via INTRAVENOUS
  Filled 2024-07-21: qty 1

## 2024-07-21 MED ORDER — IOHEXOL 350 MG/ML SOLN
100.0000 mL | Freq: Once | INTRAVENOUS | Status: AC | PRN
  Administered 2024-07-21: 100 mL via INTRAVENOUS

## 2024-07-21 NOTE — ED Triage Notes (Signed)
 Pt c/o chest pain and back pain. 325 mg of Asprin given in route. Pain is intermittent and is 6/10.

## 2024-07-21 NOTE — ED Provider Notes (Signed)
 " [facility]-EMERGENCY DEPT [facility] Emergency Department Provider Note MRN:  [id number]  Arrival date & time: 07/21/24     Chief Complaint   Chest Pain and Back Pain   History of Present Illness   Danielle Vazquez is a 54 y.o. year-old female with a history of CKD, chronic pain, degenerative disc disease presenting to the ED with chief complaint of chest pain.  Has been having left-sided low back pain and left lower quadrant pain for the past few days.  Worse with movements and positions changing.  Now having chest pain this evening with shortness of breath.  Feeling very uncomfortable.  Review of Systems  A thorough review of systems was obtained and all systems are negative except as noted in the HPI and PMH.   Patient's Health History    Past Medical History:  Diagnosis Date   Anxiety    Chronic kidney disease    kdiney stone    Chronic pain syndrome    DDD (degenerative disc disease)    has had injections in past   Degenerative disc disease    Diverticulosis    Gastritis    GERD (gastroesophageal reflux disease)    Hiatal hernia    Low back pain    Migraines    Numbness and tingling    Vertigo     Past Surgical History:  Procedure Laterality Date   CHOLECYSTECTOMY  2009   COLONOSCOPY WITH PROPOFOL  N/A 02/01/2013   DOQ:Wnmfjo mucosa in the terminal ileum/Moderate diverticulosis in the sigmoid colon/ONE RECTAL POLYP REMOVED/Small internal hemorrhoids.  Random colon biopsies negative.  Rectal polyps hyperplastic.  Next colonoscopy in 10 years with MAC   ESOPHAGOGASTRODUODENOSCOPY  April 2013   SLF: esophageal stricture, gastritis, hiatal hernia, negative H.pylori   ESOPHAGOGASTRODUODENOSCOPY (EGD) WITH ESOPHAGEAL DILATION N/A 02/01/2013   DOQ:Dumprulmz at the gastroesophageal junction/MILD Non-erosive gastritis & MODERATE DUODENITIS.  Duodenal biopsies benign.  No abnormalities.  Stomach showed mild chronic inflammation, no H. pylori.  Esophageal biopsies consistent with  GERD.   ILEOColonoscopy  09/23/2011   DOQ:Enobed, multiple in the rectum/Polyps, multiple in the distal transverse colon/Diverticulosis,mild,left sided diverticulosis/Internal hemorrhoids/path: tubular adenomas   TUBAL LIGATION  1993   TUBAL LIGATION  1993    Family History  Problem Relation Age of Onset   Heart attack Mother    Heart attack Father    Colon cancer Neg Hx    Anesthesia problems Neg Hx    Hypotension Neg Hx    Malignant hyperthermia Neg Hx    Pseudochol deficiency Neg Hx     Social History   Socioeconomic History   Marital status: Single    Spouse name: Not on file   Number of children: 3   Years of education: 11th grade   Highest education level: Not on file  Occupational History   Occupation: manufacturer's associate  Tobacco Use   Smoking status: Every Day    Current packs/day: 0.50    Average packs/day: 0.5 packs/day for 25.0 years (12.5 ttl pk-yrs)    Types: Cigarettes   Smokeless tobacco: Never  Vaping Use   Vaping status: Never Used  Substance and Sexual Activity   Alcohol use: Not Currently    Comment: rarely   Drug use: No   Sexual activity: Yes    Birth control/protection: Surgical, Other-see comments, Post-menopausal    Comment: tubal  Other Topics Concern   Not on file  Social History Narrative   Lives with her daughter and grandchildren.   Right-handed.  4-5 cups caffeine per day.   Social Drivers of Health   Tobacco Use: High Risk (07/21/2024)   Patient History    Smoking Tobacco Use: Every Day    Smokeless Tobacco Use: Never    Passive Exposure: Not on file  Financial Resource Strain: Not on file  Food Insecurity: Not on file  Transportation Needs: Not on file  Physical Activity: Not on file  Stress: Not on file  Social Connections: Not on file  Intimate Partner Violence: Not on file  Depression (EYV7-0): Not on file  Alcohol Screen: Not on file  Housing: Not on file  Utilities: Not on file  Health Literacy: Not on file      Physical Exam   Vitals:   07/21/24 0430 07/21/24 0445  BP: 119/78 114/83  Pulse: 92 89  Resp: 15 13  Temp:    SpO2: 91% 96%    CONSTITUTIONAL: Well-appearing, moderate distress due to pain NEURO/PSYCH:  Alert and oriented x 3, no focal deficits EYES:  eyes equal and reactive ENT/NECK:  no LAD, no JVD CARDIO: Tachycardic rate, well-perfused, normal S1 and S2 PULM:  CTAB no wheezing or rhonchi GI/GU:  non-distended, non-tender MSK/SPINE:  No gross deformities, no edema SKIN:  no rash, atraumatic   *Additional and/or pertinent findings included in MDM below  Diagnostic and Interventional Summary    EKG Interpretation Date/Time:  Thursday July 21 2024 03:44:51 EST Ventricular Rate:  101 PR Interval:  162 QRS Duration:  101 QT Interval:  363 QTC Calculation: 471 R Axis:   41  Text Interpretation: Sinus tachycardia Abnormal R-wave progression, early transition Baseline wander in lead(s) V5 Confirmed by Theadore Sharper [phone]) on 07/21/2024 6:00:19 AM       Labs Reviewed  COMPREHENSIVE METABOLIC PANEL WITH GFR - Abnormal; Notable for the following components:      Result Value   Glucose, Bld 206 (*)    All other components within normal limits  CBC  LIPASE, BLOOD  URINALYSIS, ROUTINE W REFLEX MICROSCOPIC  TROPONIN T, HIGH SENSITIVITY  TROPONIN T, HIGH SENSITIVITY    CT Angio Chest/Abd/Pel for Dissection W and/or Wo Contrast  Final Result    DG Chest Port 1 View  Final Result      Medications  HYDROmorphone  (DILAUDID ) injection 1 mg (1 mg Intravenous Given 07/21/24 0359)  ondansetron  (ZOFRAN ) injection 4 mg (4 mg Intravenous Given 07/21/24 0400)  iohexol  (OMNIPAQUE ) 350 MG/ML injection 100 mL (100 mLs Intravenous Contrast Given 07/21/24 0457)     Procedures  /  Critical Care Procedures  ED Course and Medical Decision Making  Initial Impression and Ddx Differential diagnosis includes MSK back pain, aortic dissection, ACS, PE, kidney stone  Past  medical/surgical history that increases complexity of ED encounter: None  Interpretation of Diagnostics I personally reviewed the EKG and my interpretation is as follows: Sinus tachycardia  No significant blood count or electrolyte disturbance.  Troponin negative x 2  Patient Reassessment and Ultimate Disposition/Management     CTA imaging is negative for acute aortic syndrome.  There is evidence to suggest diverticulitis which would explain patient's left lower quadrant pain.  Patient feeling much better, vitals normal, plans for discharge with return precautions.  Patient management required discussion with the following services or consulting groups:  None  Complexity of Problems Addressed Acute illness or injury that poses threat of life of bodily function  Additional Data Reviewed and Analyzed Further history obtained from: Prior labs/imaging results  Additional Factors Impacting ED Encounter Risk  Use of parenteral controlled substances and Consideration of hospitalization  Ozell HERO. Theadore, MD [facility] Emergency Medicine [facility] [email or web address]  Final Clinical Impressions(s) / ED Diagnoses     ICD-10-CM   1. Diverticulitis  K57.92     2. Chest pain, unspecified type  R07.9       ED Discharge Orders          Ordered    amoxicillin -clavulanate (AUGMENTIN ) 875-125 MG tablet  Every 12 hours        07/21/24 0614    naproxen  (NAPROSYN ) 500 MG tablet  2 times daily        07/21/24 0615             Discharge Instructions Discussed with and Provided to Patient:     Discharge Instructions      You were evaluated in the Emergency Department and after careful evaluation, we did not find any emergent condition requiring admission or further testing in the hospital.  Your exam/testing today was overall reassuring.  Pain likely due to diverticulitis.  Take the Naprosyn  anti-inflammatory twice daily for pain.  Use the Augmentin  to treat the  infection.  Follow-up with your primary care doctor.  Please return to the Emergency Department if you experience any worsening of your condition.  Thank you for allowing us  to be a part of your care.        Theadore Ozell HERO, MD 07/21/24 [phone]  "

## 2024-07-21 NOTE — Discharge Instructions (Signed)
 You were evaluated in the Emergency Department and after careful evaluation, we did not find any emergent condition requiring admission or further testing in the hospital.  Your exam/testing today was overall reassuring.  Pain likely due to diverticulitis.  Take the Naprosyn  anti-inflammatory twice daily for pain.  Use the Augmentin  to treat the infection.  Follow-up with your primary care doctor.  Please return to the Emergency Department if you experience any worsening of your condition.  Thank you for allowing us  to be a part of your care.

## 2024-07-21 NOTE — ED Notes (Addendum)
 Pt ambulatory to restroom
